# Patient Record
Sex: Female | Born: 1990 | Race: White | Hispanic: No | Marital: Married | Smoking: Never smoker
Health system: Southern US, Community
[De-identification: ages and names within clinical notes are randomized; demographics above are authoritative.]

## PROBLEM LIST (undated history)

## (undated) ENCOUNTER — Inpatient Hospital Stay: Payer: Self-pay

## (undated) DIAGNOSIS — F419 Anxiety disorder, unspecified: Secondary | ICD-10-CM

## (undated) HISTORY — PX: TONSILECTOMY, ADENOIDECTOMY, BILATERAL MYRINGOTOMY AND TUBES: SHX2538

---

## 2018-07-06 DIAGNOSIS — Z3480 Encounter for supervision of other normal pregnancy, unspecified trimester: Secondary | ICD-10-CM | POA: Insufficient documentation

## 2019-11-11 DIAGNOSIS — F411 Generalized anxiety disorder: Secondary | ICD-10-CM | POA: Insufficient documentation

## 2021-04-06 ENCOUNTER — Other Ambulatory Visit: Payer: Self-pay

## 2021-04-06 ENCOUNTER — Encounter: Payer: Self-pay | Admitting: Emergency Medicine

## 2021-04-06 ENCOUNTER — Emergency Department: Payer: BC Managed Care – PPO

## 2021-04-06 ENCOUNTER — Emergency Department
Admission: EM | Admit: 2021-04-06 | Discharge: 2021-04-06 | Disposition: A | Discharge status: Home / Self Care | Payer: BC Managed Care – PPO | Attending: Emergency Medicine | Admitting: Emergency Medicine

## 2021-04-06 DIAGNOSIS — R202 Paresthesia of skin: Secondary | ICD-10-CM

## 2021-04-06 DIAGNOSIS — G43809 Other migraine, not intractable, without status migrainosus: Secondary | ICD-10-CM | POA: Insufficient documentation

## 2021-04-06 DIAGNOSIS — F419 Anxiety disorder, unspecified: Secondary | ICD-10-CM | POA: Diagnosis not present

## 2021-04-06 DIAGNOSIS — G43109 Migraine with aura, not intractable, without status migrainosus: Secondary | ICD-10-CM

## 2021-04-06 HISTORY — DX: Anxiety disorder, unspecified: F41.9

## 2021-04-06 LAB — BASIC METABOLIC PANEL
Anion gap: 11 (ref 5–15)
BUN: 10 mg/dL (ref 6–20)
CO2: 23 mmol/L (ref 22–32)
Calcium: 9 mg/dL (ref 8.9–10.3)
Chloride: 103 mmol/L (ref 98–111)
Creatinine, Ser: 0.74 mg/dL (ref 0.44–1.00)
GFR, Estimated: >60 mL/min (ref >60)
Glucose, Bld: 97 mg/dL (ref 70–99)
Potassium: 3.8 mmol/L (ref 3.5–5.1)
Sodium: 137 mmol/L (ref 135–145)

## 2021-04-06 LAB — CBC
HCT: 37.6 % (ref 36.0–46.0)
Hemoglobin: 12.8 g/dL (ref 12.0–15.0)
MCH: 31.3 pg (ref 26.0–34.0)
MCHC: 34 g/dL (ref 30.0–36.0)
MCV: 91.9 fL (ref 80.0–100.0)
Platelets: 281 10*3/uL (ref 150–400)
RBC: 4.09 MIL/uL (ref 3.87–5.11)
RDW: 13.4 % (ref 11.5–15.5)
WBC: 6.9 10*3/uL (ref 4.0–10.5)
nRBC: 0 % (ref 0.0–0.2)

## 2021-04-06 LAB — PREGNANCY, URINE: Preg Test, Ur: NEGATIVE

## 2021-04-06 LAB — MAGNESIUM: Magnesium: 2 mg/dL (ref 1.7–2.4)

## 2021-04-06 IMAGING — CT CT CERVICAL SPINE W/O CM
3 of 4 series · 10 of 33 positions shown, 11 images · non-contrast
Comparison: None.

CLINICAL DATA: Focal neuro deficit or paresthesias; technologist
note states tingling on left side

EXAM:
CT CERVICAL SPINE WITHOUT CONTRAST
TECHNIQUE: Multidetector CT imaging of the cervical spine was performed without
intravenous contrast. Multiplanar CT image reconstructions were also
generated.

[Series 6: sagittal bone · sagittal · 0.20mm/px · 5 of 37 slices shown]
[im 13/37  bone]
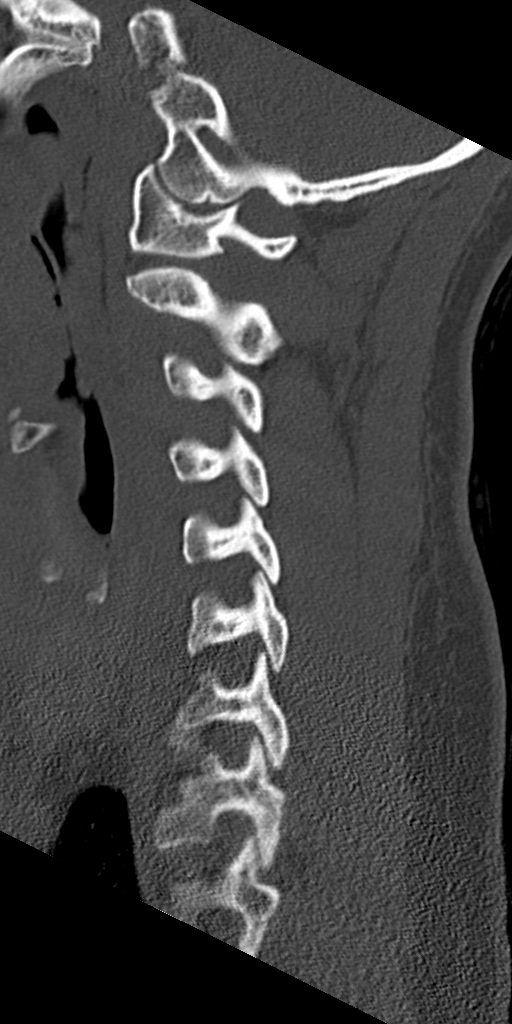
[im 16/37  bone]
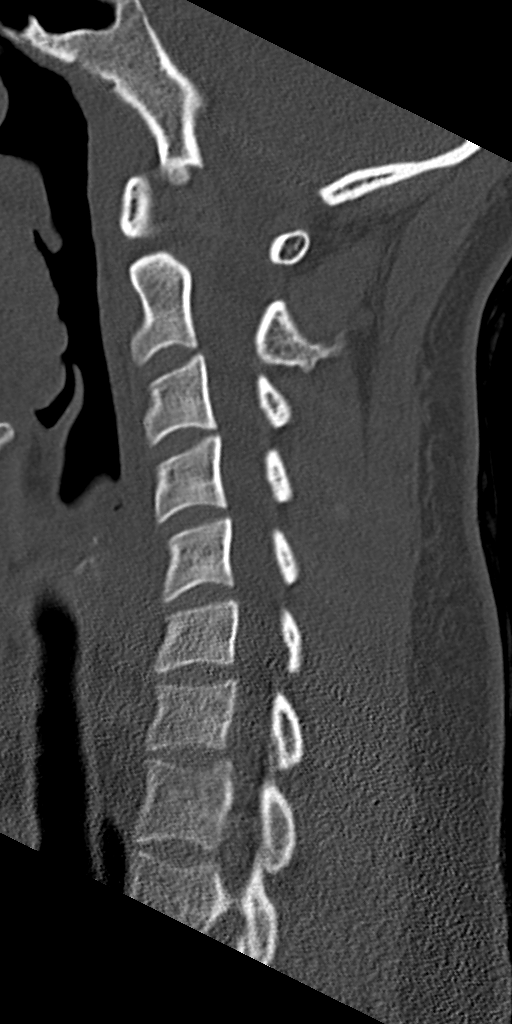
[im 19/37  bone]
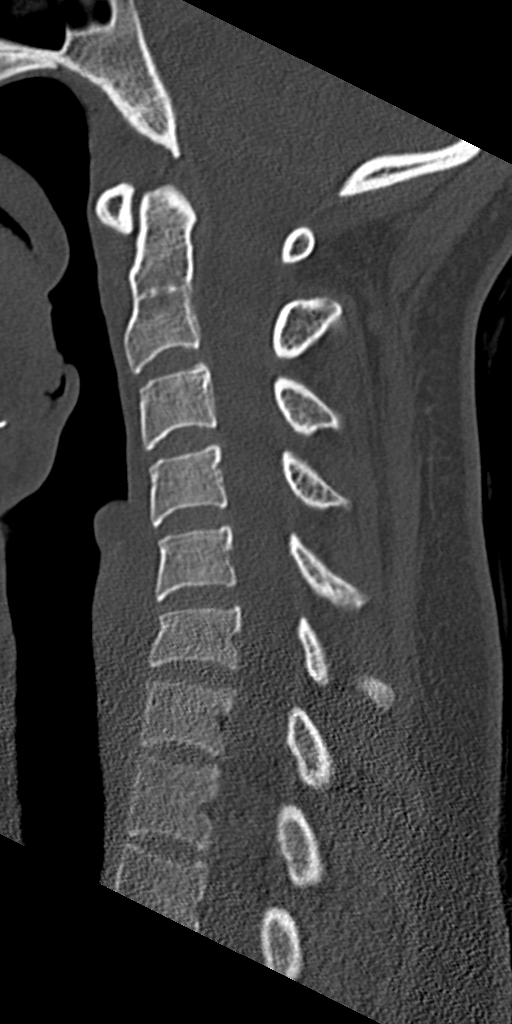
[im 22/37  bone]
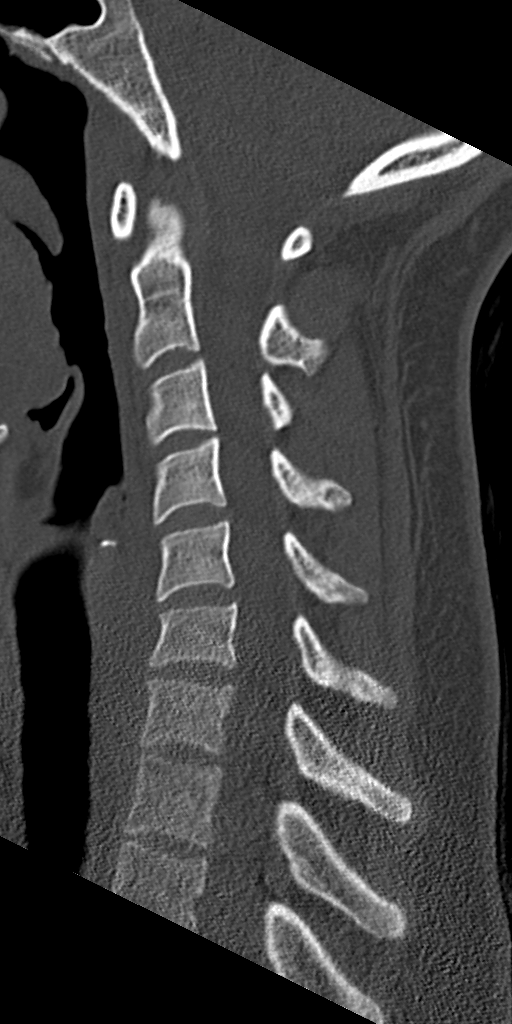
[im 25/37  bone]
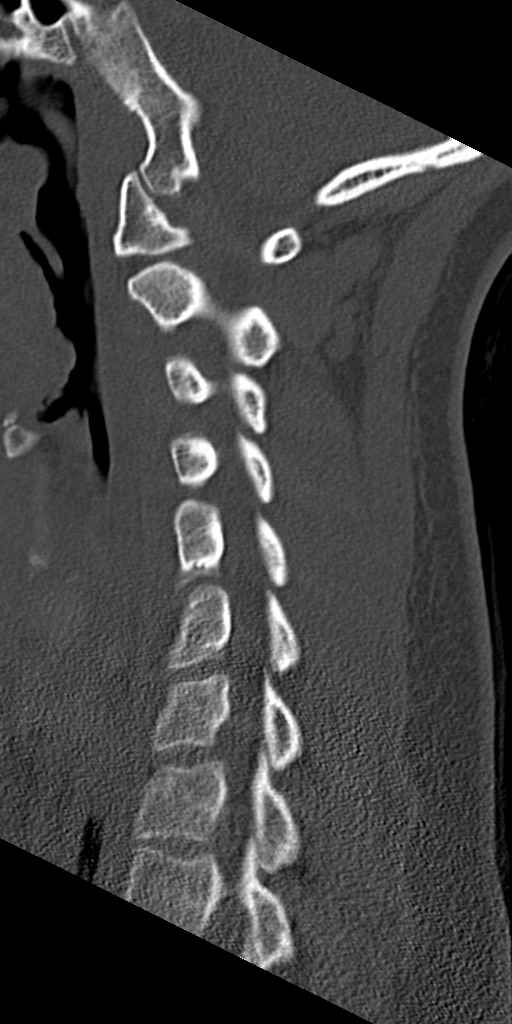

[Series 7: coronal bone · coronal · 0.17mm/px · 3 of 32 slices shown]
[im 7/32  bone]
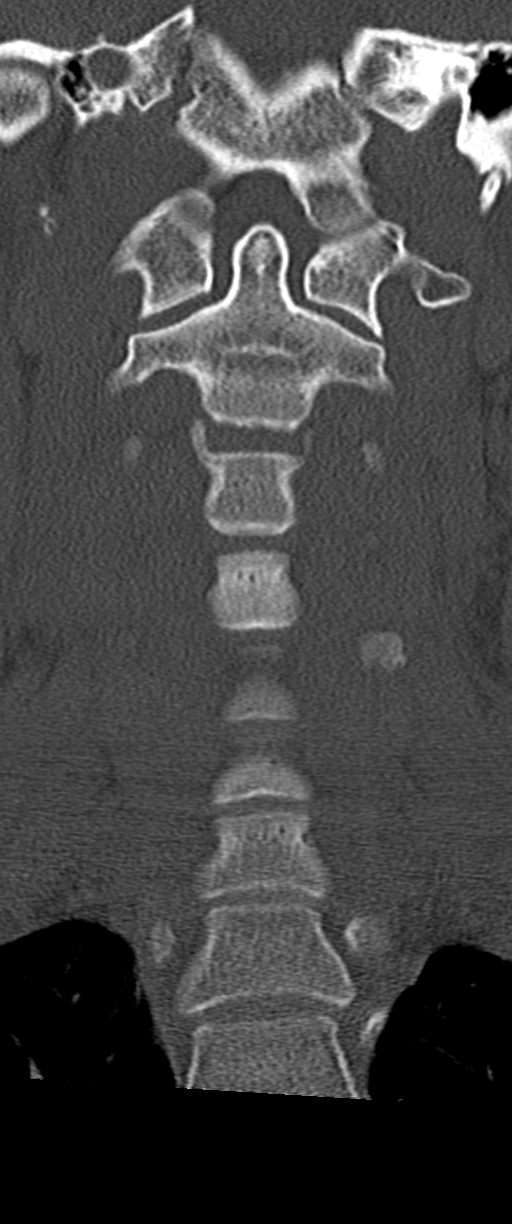
[im 13/32  bone]
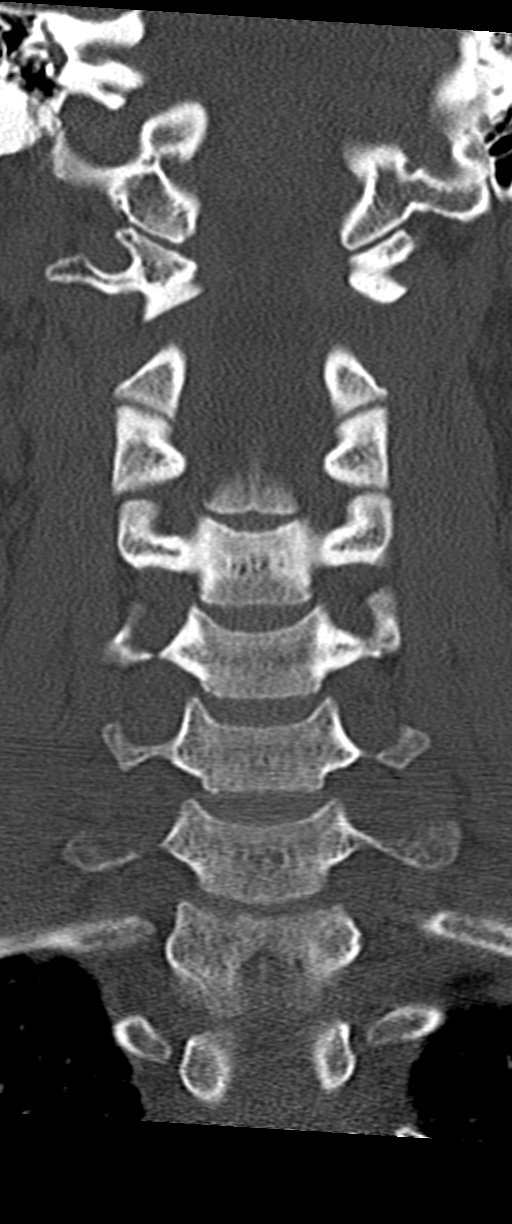
[im 19/32  bone]
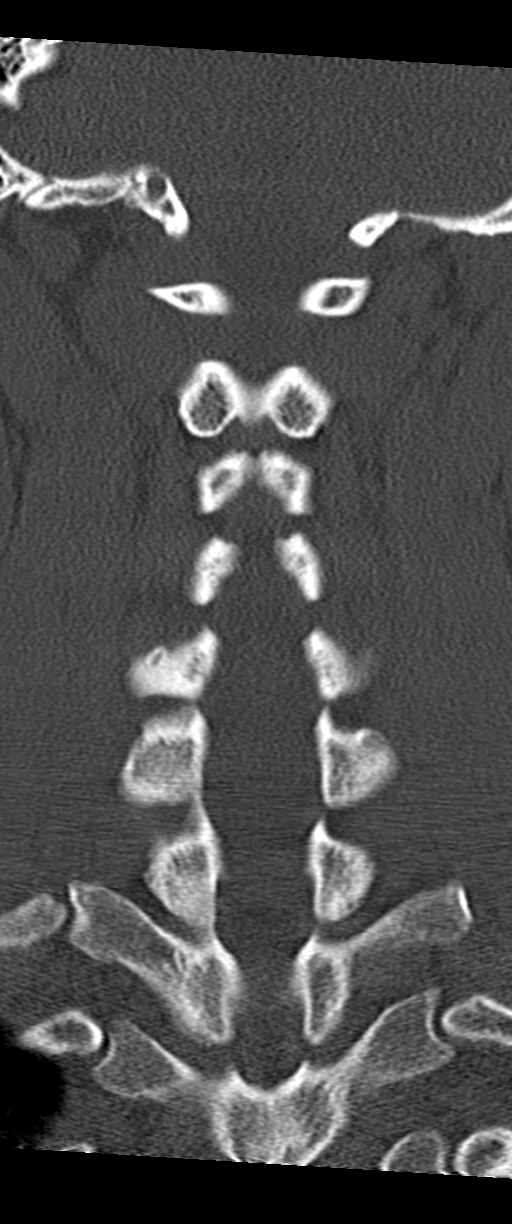

[Series 8: orthogonal bone · axial · 0.19mm/px · z∈[-312,-264]mm · 2 of 82 slices shown, 3 images]
[im 28/82  soft-tissue]
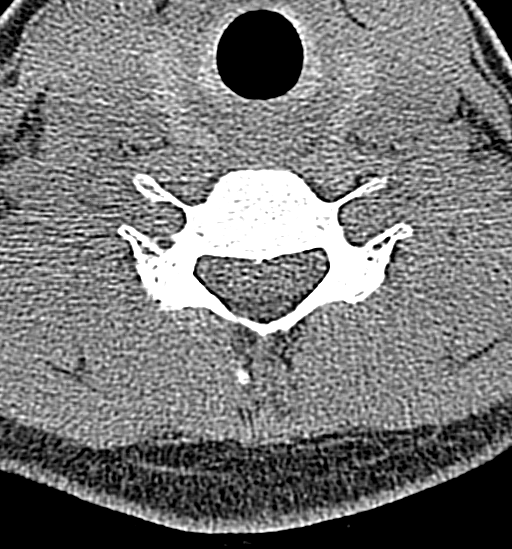
[im 28/82  bone]
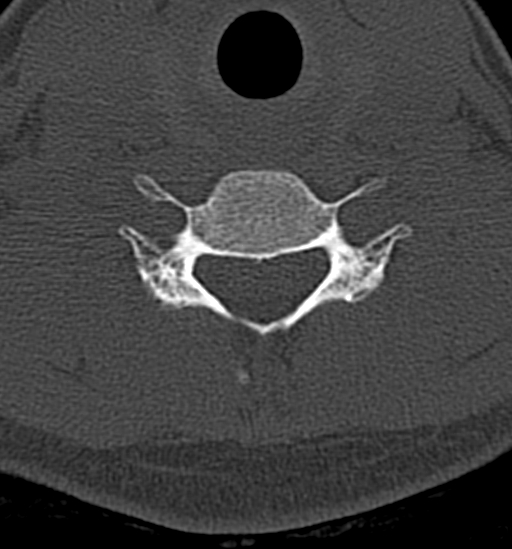
[im 55/82  bone]
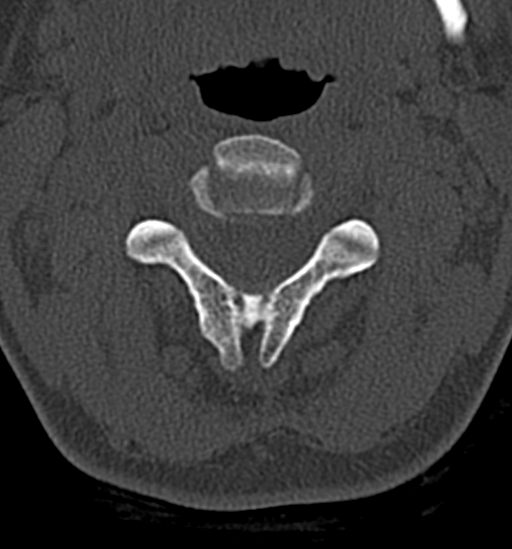

[10 of 33 positions shown; findings below may reference images not displayed]

FINDINGS: Alignment: Anteroposterior alignment is maintained.

Skull base and vertebrae: Vertebral body heights are preserved. No
acute fracture.

Soft tissues and spinal canal: No prevertebral fluid or swelling. No
visible canal hematoma.

Disc levels: Intervertebral disc heights are maintained. No
degenerative stenosis.

Upper chest: Included lung apices are clear.

Other: None.
IMPRESSION: No acute or significant abnormality.

## 2021-04-06 IMAGING — CT CT HEAD W/O CM
4 series · 16 of 47 positions shown, 18 images · non-contrast
Comparison: None.

CLINICAL DATA: Neuro deficit, acute, stroke suspected; technologist
note states numbness and tingling on left side

EXAM:
CT HEAD WITHOUT CONTRAST
TECHNIQUE: Contiguous axial images were obtained from the base of the skull
through the vertex without intravenous contrast.

[Series 2: head bone · axial · 0.43mm/px · z∈[-179,-151]mm · 3 of 70 slices shown]
[im 7/70  bone]
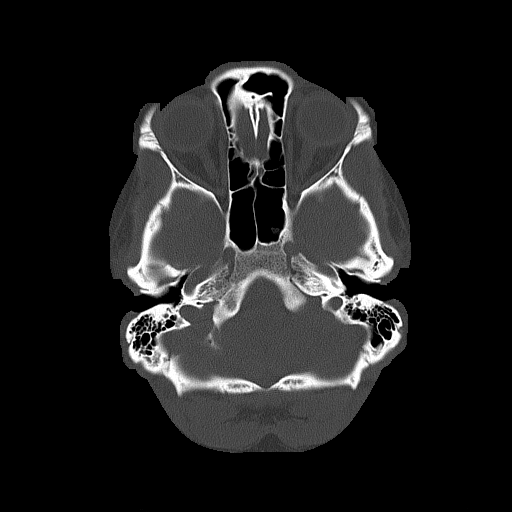
[im 14/70  bone]
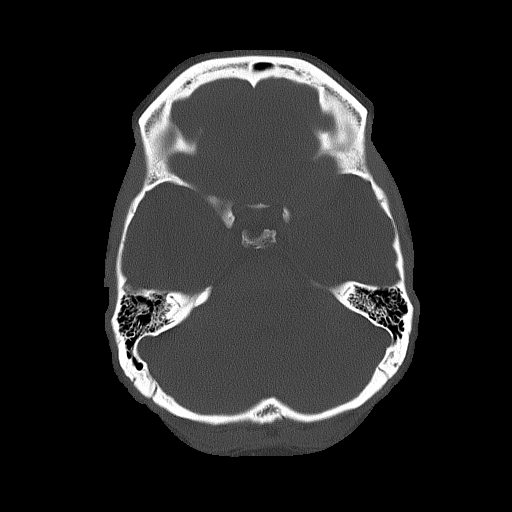
[im 21/70  bone]
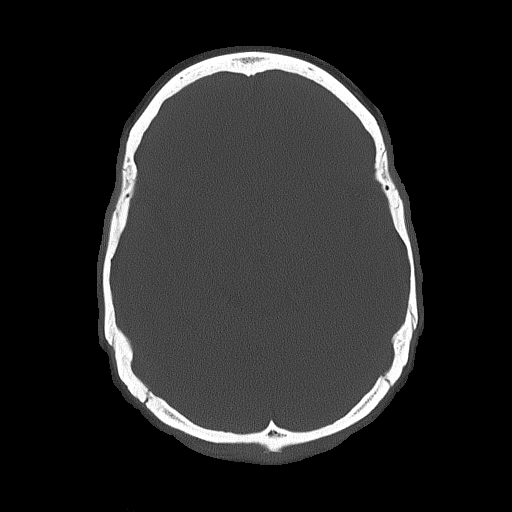

[Series 3: head wo · axial · 0.43mm/px · z∈[-176,-76]mm · 7 of 28 slices shown, 9 images]
[im 4/28  brain]
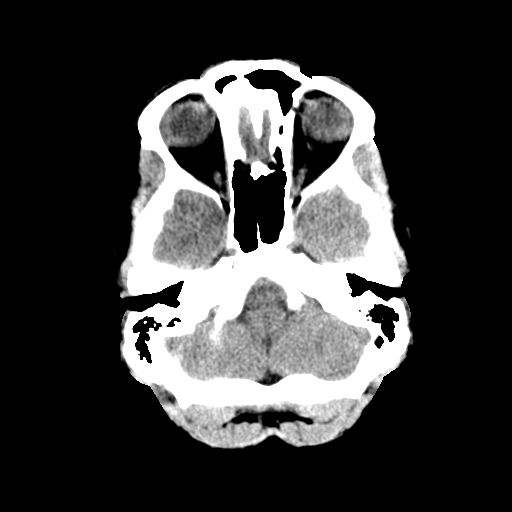
[im 4/28  bone]
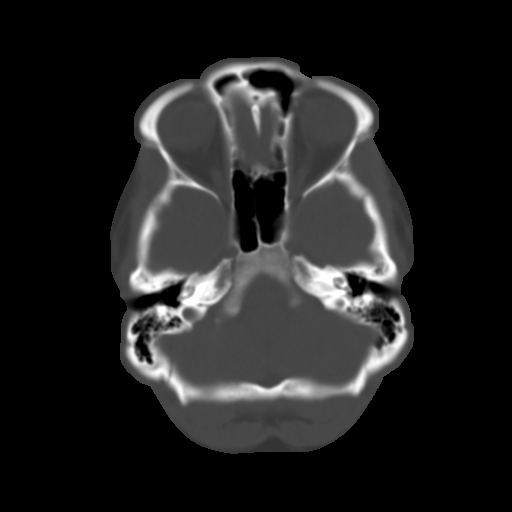
[im 7/28  brain]
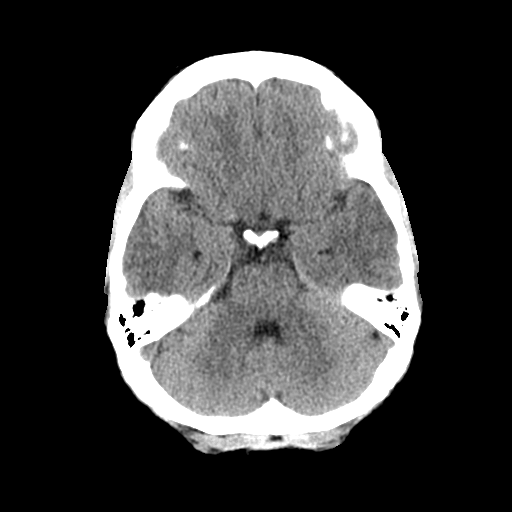
[im 11/28  brain]
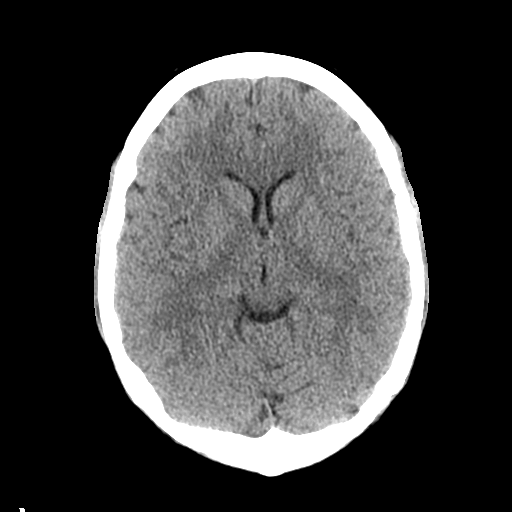
[im 14/28  brain]
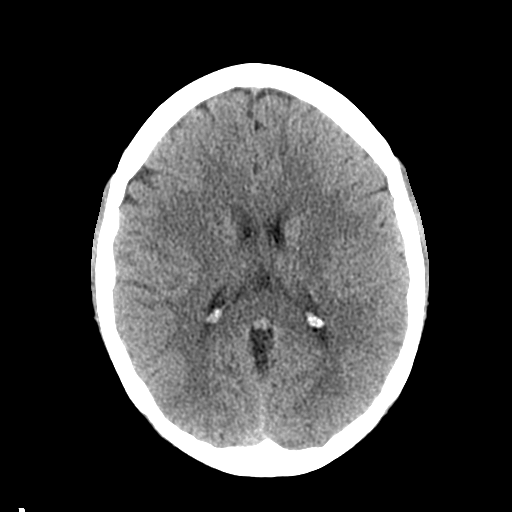
[im 17/28  brain]
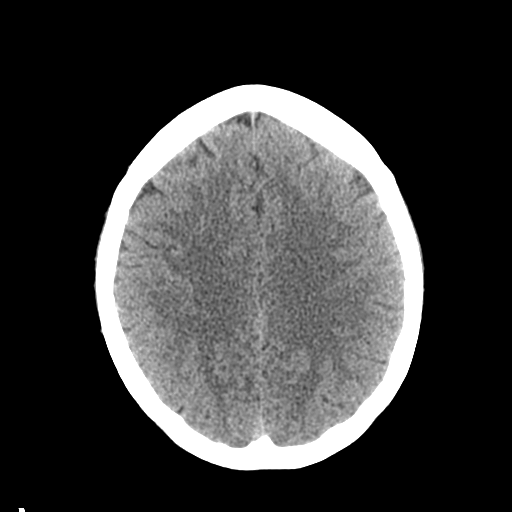
[im 17/28  bone]
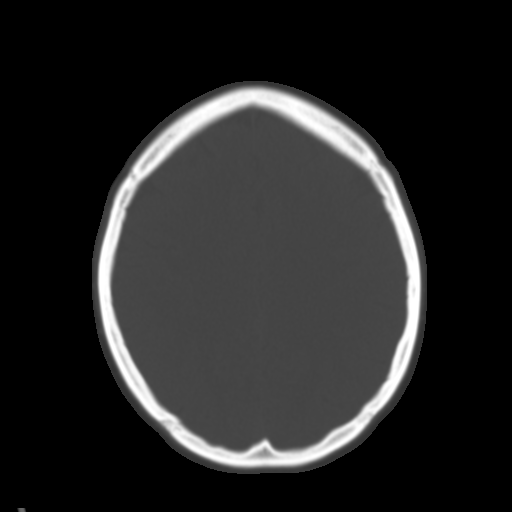
[im 21/28  brain]
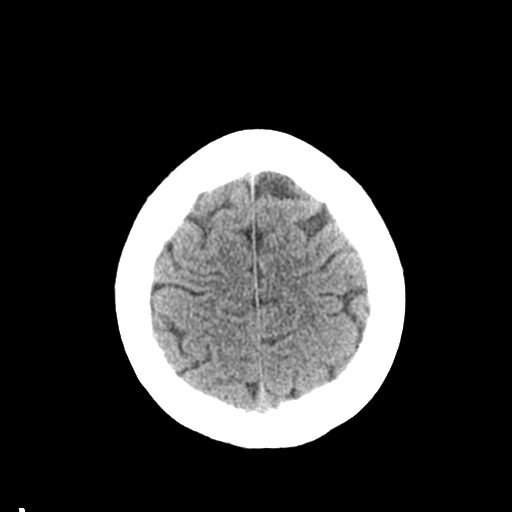
[im 24/28  brain]
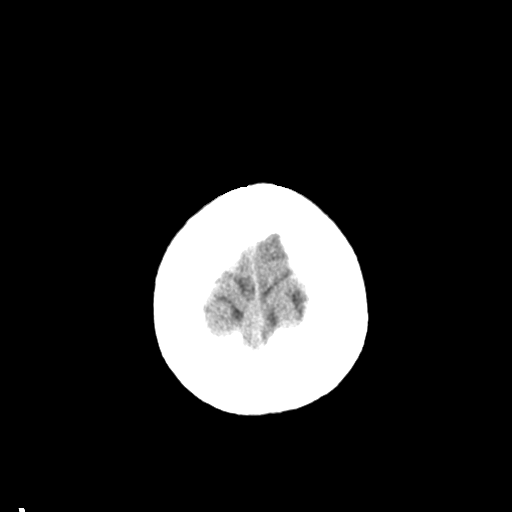

[Series 4: coronal soft tissue · coronal · 0.29mm/px · 3 of 61 slices shown]
[im 21/61  brain]
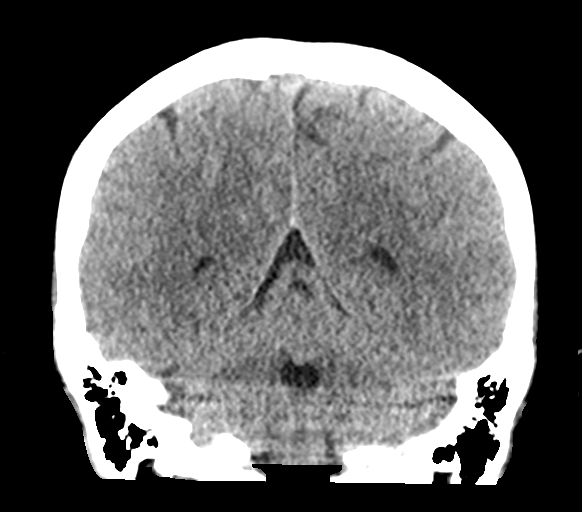
[im 27/61  brain]
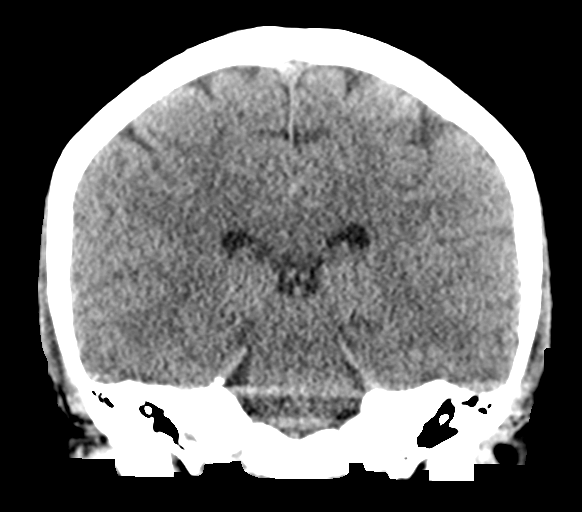
[im 34/61  brain]
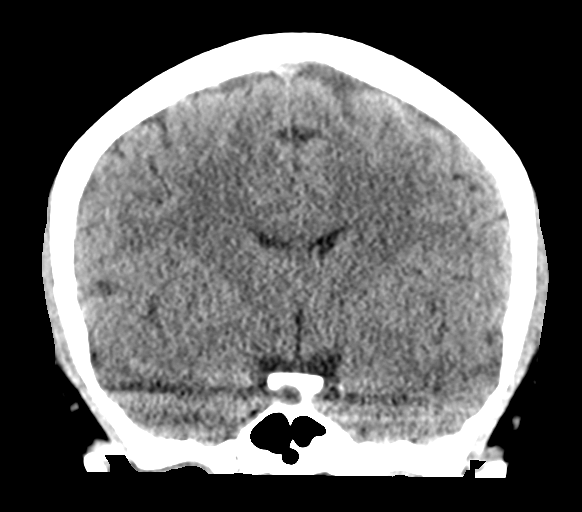

[Series 5: sagittal soft tissue · sagittal · 0.29mm/px · 3 of 52 slices shown]
[im 18/52  brain]
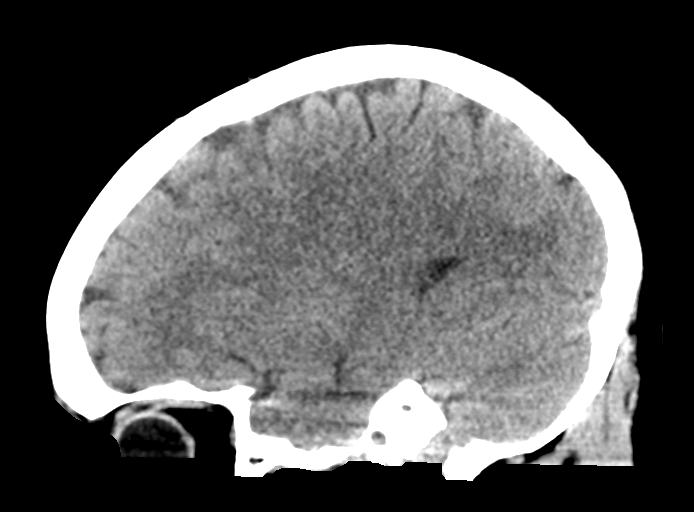
[im 26/52  brain]
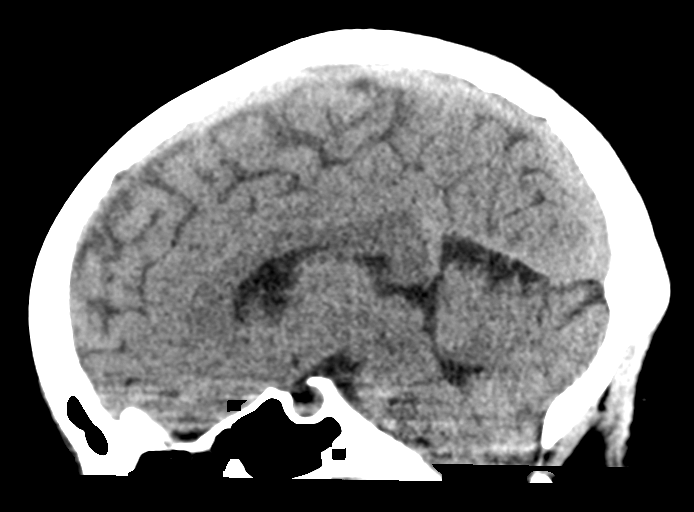
[im 35/52  brain]
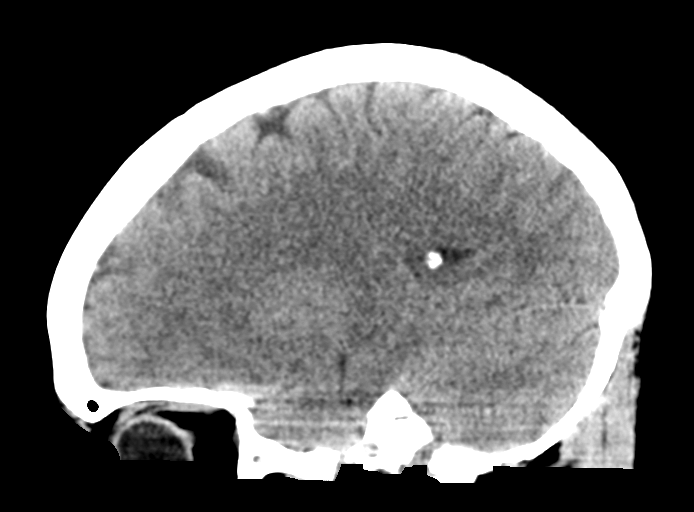

[16 of 47 positions shown; findings below may reference images not displayed]

FINDINGS: Brain: There is no acute intracranial hemorrhage, mass effect, or
edema. Gray-white differentiation is preserved. There is no
extra-axial fluid collection. Ventricles and sulci are within normal
limits in size and configuration.

Vascular: No hyperdense vessel or unexpected calcification.

Skull: Calvarium is unremarkable.

Sinuses/Orbits: Mild mucosal thickening.  Orbits are unremarkable.

Other: None.
IMPRESSION: No acute intracranial abnormality.

## 2021-04-06 MED ORDER — DIPHENHYDRAMINE HCL 50 MG/ML IJ SOLN
25.0000 mg | Freq: Once | INTRAMUSCULAR | Status: AC
  Administered 2021-04-06: 25 mg via INTRAVENOUS
  Filled 2021-04-06: qty 1

## 2021-04-06 MED ORDER — PROCHLORPERAZINE EDISYLATE 10 MG/2ML IJ SOLN
10.0000 mg | Freq: Once | INTRAMUSCULAR | Status: AC
  Administered 2021-04-06: 10 mg via INTRAVENOUS
  Filled 2021-04-06: qty 2

## 2021-04-06 MED ORDER — SODIUM CHLORIDE 0.9 % IV BOLUS
1000.0000 mL | Freq: Once | INTRAVENOUS | Status: AC
  Administered 2021-04-06: 1000 mL via INTRAVENOUS

## 2021-04-06 NOTE — ED Provider Notes (Signed)
[facility] Emergency Department Provider Note   ____________________________________________   Event Date/Time   First MD Initiated Contact with Patient 04/06/21 1139     (approximate)  I have reviewed the triage vital signs and the nursing notes.   HISTORY  Chief Complaint Numbness    HPI Aimee Jackson is a 30 y.o. female with past medical history of anxiety presents to the ED complaining of numbness.  Patient reports that she has been dealing with numbness and tingling in both sides of her neck over the past couple of days.  She went to see her chiropractor for this, but since yesterday has been dealing with waxing and waning numbness and tingling affecting her left arm and left leg.  She denies having any significant adjustment performed at her chiropractor.  She denies any numbness or weakness in her arm or leg and has not had any recent trauma to her head or neck.  She does state that she has had a mild headache with light sensitivity.  She denies any fevers, neck stiffness, cough, chest pain, or shortness of breath.  Her LMP was approximately 2 weeks ago.  She denies any history of migraines and has never had similar symptoms in the past.  She does state that she has been under a lot of stress lately with increased anxiety.        Past Medical History:  Diagnosis Date   Anxiety     There are no problems to display for this patient.   History reviewed. No pertinent surgical history.  Prior to Admission medications   Not on File    Allergies Aleve [naproxen] and Codeine  No family history on file.  Social History    Review of Systems  Constitutional: No fever/chills Eyes: No visual changes. ENT: No sore throat. Cardiovascular: Denies chest pain. Respiratory: Denies shortness of breath. Gastrointestinal: No abdominal pain.  No nausea, no vomiting.  No diarrhea.  No constipation. Genitourinary: Negative for  dysuria. Musculoskeletal: Negative for back pain. Skin: Negative for rash. Neurological: Positive for headache, negative for focal weakness, positive for numbness.  ____________________________________________   PHYSICAL EXAM:  VITAL SIGNS: ED Triage Vitals  Enc Vitals Group     BP 04/06/21 0908 133/86     Pulse Rate 04/06/21 0908 96     Resp 04/06/21 0908 20     Temp 04/06/21 0908 98.6 F (37 C)     Temp Source 04/06/21 0908 Oral     SpO2 04/06/21 0908 100 %     Weight 04/06/21 0909 155 lb (70.3 kg)     Height 04/06/21 0909 5\' 5"  (1.651 m)     Head Circumference --      Peak Flow --      Pain Score 04/06/21 0908 0     Pain Loc --      Pain Edu? --      Excl. in GC? --     Constitutional: Alert and oriented. Eyes: Conjunctivae are normal.  Pupils equal, round, and reactive to light bilaterally. Head: Atraumatic. Nose: No congestion/rhinnorhea. Mouth/Throat: Mucous membranes are moist. Neck: Normal ROM, no midline cervical spine tenderness to palpation. Cardiovascular: Normal rate, regular rhythm. Grossly normal heart sounds.  2+ radial pulses bilaterally. Respiratory: Normal respiratory effort.  No retractions. Lungs CTAB. Gastrointestinal: Soft and nontender. No distention. Genitourinary: deferred Musculoskeletal: No lower extremity tenderness nor edema. Neurologic:  Normal speech and language. No gross focal neurologic deficits are appreciated. Skin:  Skin is warm,  dry and intact. No rash noted. Psychiatric: Mood and affect are normal. Speech and behavior are normal.  ____________________________________________   LABS (all labs ordered are listed, but only abnormal results are displayed)  Labs Reviewed  CBC  BASIC METABOLIC PANEL  MAGNESIUM  PREGNANCY, URINE   ____________________________________________  EKG  ED ECG REPORT I, Chesley Noon, the attending physician, personally viewed and interpreted this ECG.   Date: 04/06/2021  EKG Time: 12:59   Rate: 66  Rhythm: normal sinus rhythm  Axis: Normal  Intervals:none  ST&T Change: None   PROCEDURES  Procedure(s) performed (including Critical Care):  Procedures   ____________________________________________   INITIAL IMPRESSION / ASSESSMENT AND PLAN / ED COURSE      30 year old female with past medical history of anxiety presents to the ED with numbness and tingling to her neck along with numbness and tingling extending down her left arm and left leg is been associated with headache and some light sensitivity, although she denies any history of migraines.  She does not appear to have any focal neurologic deficits on exam, strength is intact in all 4 extremities.  CT head and cervical spine were performed and negative for acute process.  Labs are unremarkable with no electrolyte abnormality.  She was given a migraine cocktail for possible complicated migraine, now states that her symptoms are improved.  There is no history of significant neck pain or trauma to suggest cervical cord compression and no pain or neurologic deficits to suggest carotid artery dissection.  She is appropriate for discharge home with neurology follow-up for paresthesias, was counseled to return to the ED for new worsening symptoms, patient agrees with plan.      ____________________________________________   FINAL CLINICAL IMPRESSION(S) / ED DIAGNOSES  Final diagnoses:  Paresthesia  Complicated migraine  Anxiety     ED Discharge Orders     None        Note:  This document was prepared using Dragon voice recognition software and may include unintentional dictation errors.    Chesley Noon, MD 04/06/21 1538

## 2021-04-06 NOTE — ED Triage Notes (Addendum)
Pt comes into the ED via POV c/o numbness and tingling on the left side of her body that started yesterday.  Pt states initially she had numbness at the base of her neck and neck pain, she then went to the chiropractor to see if it would help, and then the numbness progressed to the left side of face, leg, and arm.  Pt ambulatory to triage at this time with no drift noted.  Pt tearful in triage currently but has even and unlabored respirations. Pt does admit to h/o anxiety and feels like it may be playing a large role in the symptoms today.

## 2021-04-10 ENCOUNTER — Emergency Department: Payer: BC Managed Care – PPO

## 2021-04-10 ENCOUNTER — Other Ambulatory Visit: Payer: Self-pay

## 2021-04-10 ENCOUNTER — Emergency Department
Admission: EM | Admit: 2021-04-10 | Discharge: 2021-04-10 | Disposition: A | Discharge status: Home / Self Care | Payer: BC Managed Care – PPO | Attending: Emergency Medicine | Admitting: Emergency Medicine

## 2021-04-10 DIAGNOSIS — R202 Paresthesia of skin: Secondary | ICD-10-CM | POA: Diagnosis not present

## 2021-04-10 DIAGNOSIS — R0789 Other chest pain: Secondary | ICD-10-CM | POA: Diagnosis not present

## 2021-04-10 DIAGNOSIS — R0602 Shortness of breath: Secondary | ICD-10-CM | POA: Insufficient documentation

## 2021-04-10 DIAGNOSIS — Z79899 Other long term (current) drug therapy: Secondary | ICD-10-CM | POA: Insufficient documentation

## 2021-04-10 DIAGNOSIS — R079 Chest pain, unspecified: Secondary | ICD-10-CM | POA: Diagnosis present

## 2021-04-10 LAB — CBC
HCT: 38.5 % (ref 36.0–46.0)
Hemoglobin: 13.4 g/dL (ref 12.0–15.0)
MCH: 31.6 pg (ref 26.0–34.0)
MCHC: 34.8 g/dL (ref 30.0–36.0)
MCV: 90.8 fL (ref 80.0–100.0)
Platelets: 278 10*3/uL (ref 150–400)
RBC: 4.24 MIL/uL (ref 3.87–5.11)
RDW: 13.2 % (ref 11.5–15.5)
WBC: 8.5 10*3/uL (ref 4.0–10.5)
nRBC: 0 % (ref 0.0–0.2)

## 2021-04-10 LAB — COMPREHENSIVE METABOLIC PANEL
ALT: 13 U/L (ref 0–44)
AST: 20 U/L (ref 15–41)
Albumin: 4.6 g/dL (ref 3.5–5.0)
Alkaline Phosphatase: 52 U/L (ref 38–126)
Anion gap: 9 (ref 5–15)
BUN: 9 mg/dL (ref 6–20)
CO2: 28 mmol/L (ref 22–32)
Calcium: 10.6 mg/dL — ABNORMAL HIGH (ref 8.9–10.3)
Chloride: 101 mmol/L (ref 98–111)
Creatinine, Ser: 0.7 mg/dL (ref 0.44–1.00)
GFR, Estimated: >60 mL/min (ref >60)
Glucose, Bld: 105 mg/dL — ABNORMAL HIGH (ref 70–99)
Potassium: 3.8 mmol/L (ref 3.5–5.1)
Sodium: 138 mmol/L (ref 135–145)
Total Bilirubin: 0.9 mg/dL (ref 0.3–1.2)
Total Protein: 7.6 g/dL (ref 6.5–8.1)

## 2021-04-10 LAB — HCG, QUANTITATIVE, PREGNANCY: hCG, Beta Chain, Quant, S: 1 m[IU]/mL (ref <5)

## 2021-04-10 LAB — D-DIMER, QUANTITATIVE: D-Dimer, Quant: 0.46 ug/mL-FEU (ref 0.00–0.50)

## 2021-04-10 LAB — TROPONIN I (HIGH SENSITIVITY)
Troponin I (High Sensitivity): 3 ng/L (ref <18)
Troponin I (High Sensitivity): <2 ng/L (ref <18)

## 2021-04-10 IMAGING — CR DG CHEST 2V
2 series · 2 of 2 positions shown · non-contrast
Comparison: None.

CLINICAL DATA: This study identified as missing a report at [DATE] on [DATE].

30-year-old female with chest pain. Left arm tightness. Intermittent
symptoms since last week.
EXAM:
CHEST - 2 VIEW

[chest pa]
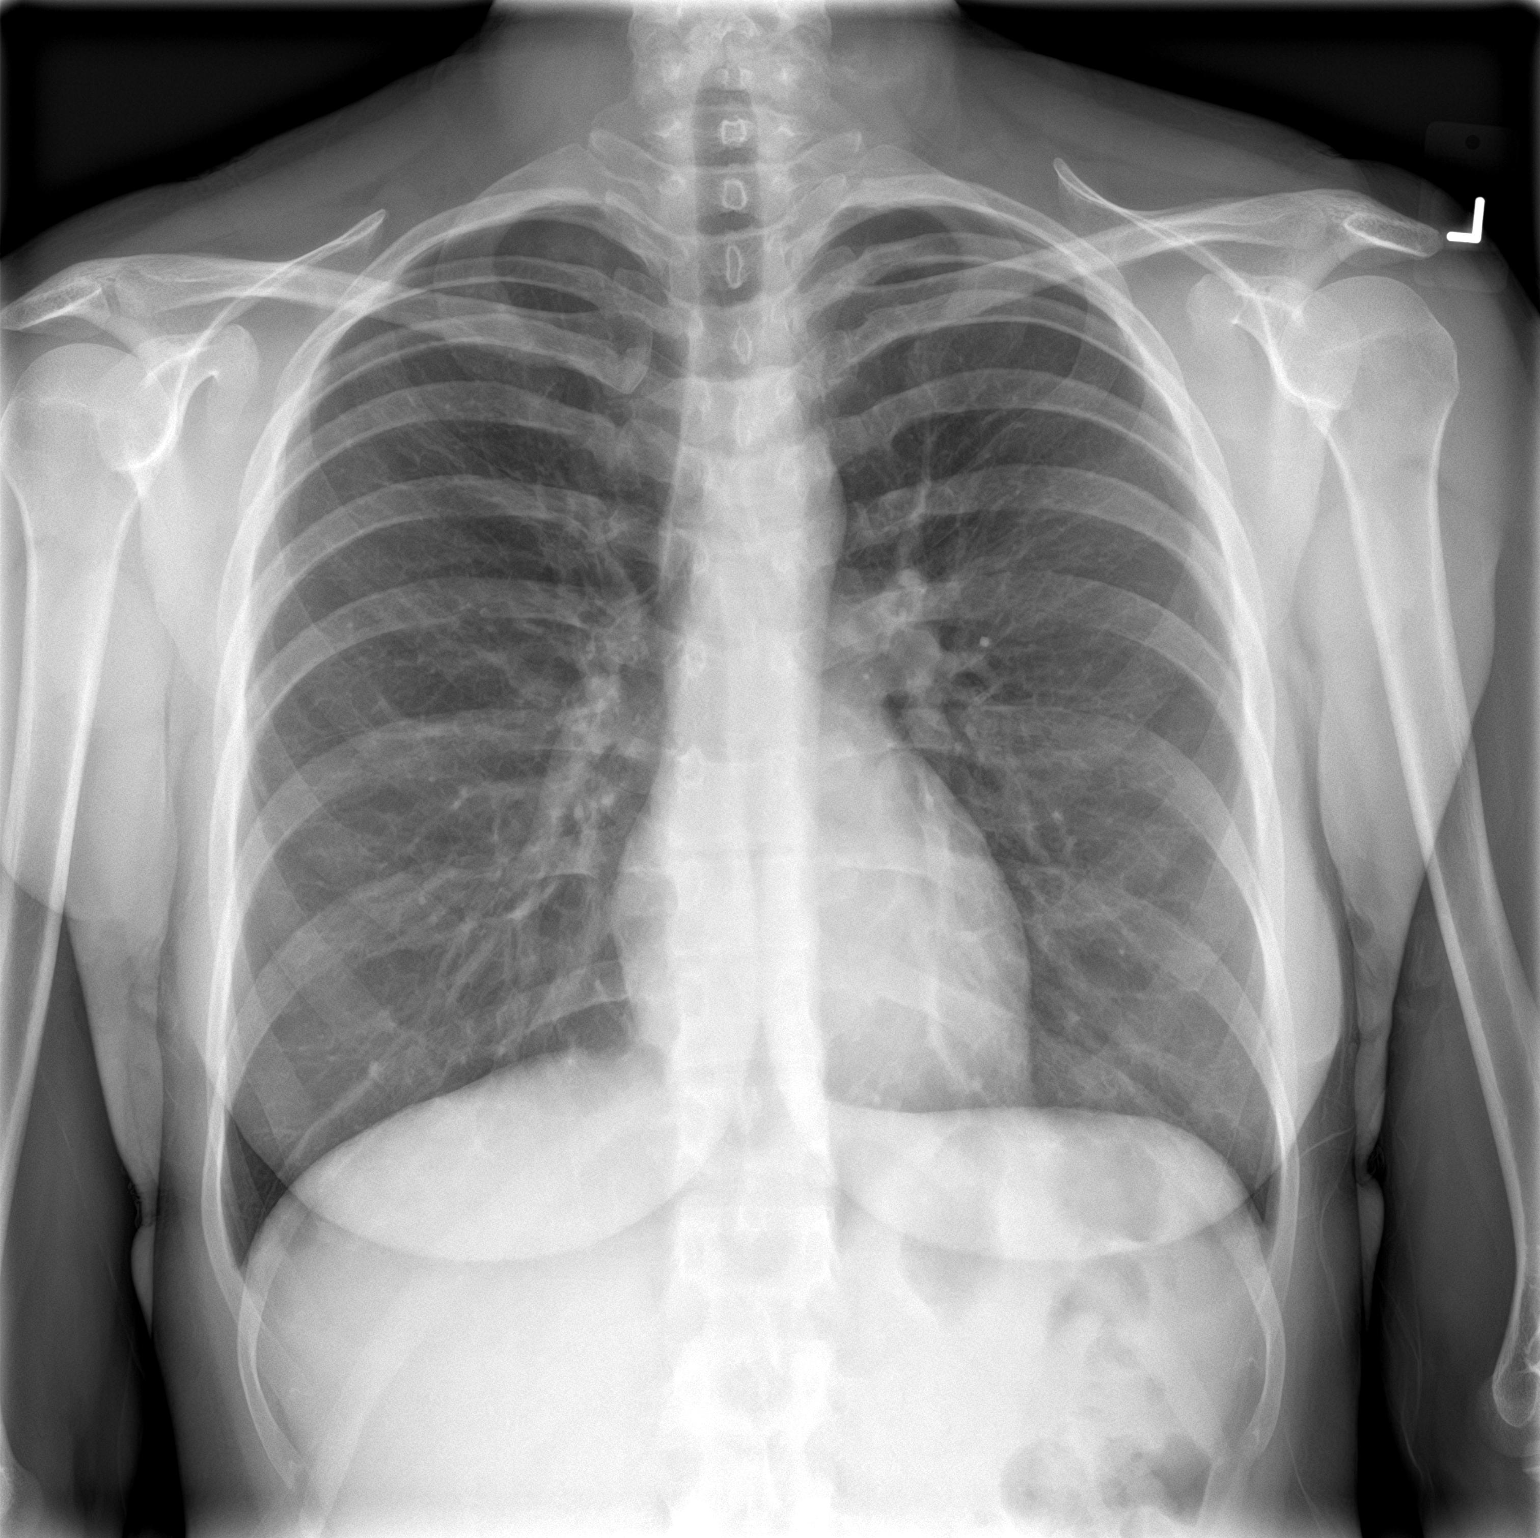

[chest lat]
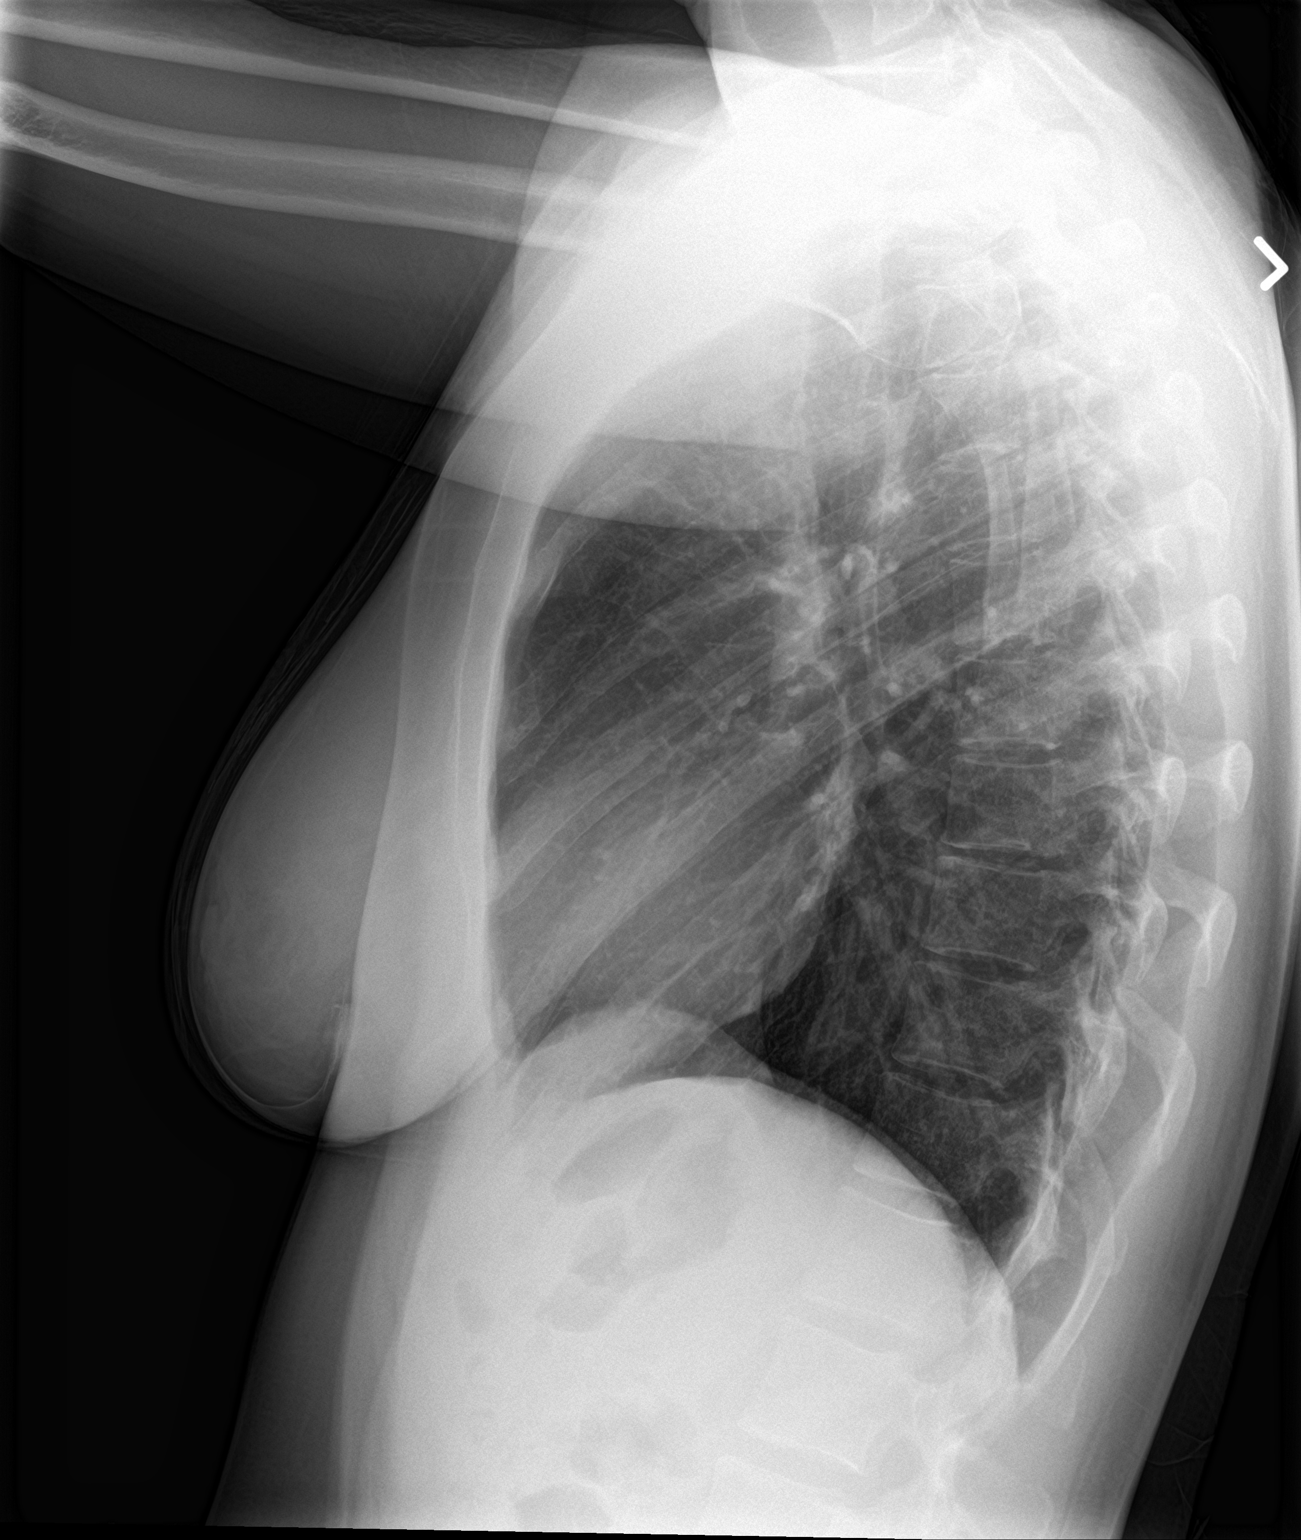

[2 of 2 positions shown; findings below may reference images not displayed]

FINDINGS: Normal lung volumes and mediastinal contours. Visualized tracheal
air column is within normal limits. Both lungs appear clear. No
pneumothorax or pleural effusion.

No osseous abnormality identified. Negative visible bowel gas
pattern.
IMPRESSION: Negative.  No cardiopulmonary abnormality.

## 2021-04-10 MED ORDER — LORAZEPAM 2 MG/ML IJ SOLN
1.0000 mg | Freq: Once | INTRAMUSCULAR | Status: AC
  Administered 2021-04-10: 1 mg via INTRAVENOUS
  Filled 2021-04-10: qty 1

## 2021-04-10 MED ORDER — LORAZEPAM 1 MG PO TABS: 1.0000 mg | ORAL_TABLET | Freq: Three times a day (TID) | ORAL | 0 refills | Status: AC | PRN

## 2021-04-10 NOTE — ED Provider Notes (Signed)
[facility] Emergency Department Provider Note  ____________________________________________   Event Date/Time   First MD Initiated Contact with Patient 04/10/21 [phone]     (approximate)  I have reviewed the triage vital signs and the nursing notes.   HISTORY  Chief Complaint No chief complaint on file.    HPI Shataria Crist is a 30 y.o. female with history of postpartum anxiety who presents to the emergency department with complaints of intermittent chest tightness that goes into her shoulders, shortness of breath, tingling in the left face and arm with clammy hands and feet that started Thursday, August 18.  States she is also feeling "foggy minded".  Denies fevers, cough, nausea, vomiting, diaphoresis, dizziness.  Was seen here in the emergency department on August 19 and had normal labs, CT head and cervical spine.  She states she does vape.  Has a copper IUD.  No history of PE, DVT, exogenous estrogen use, recent fractures, surgery, trauma, hospitalization, prolonged travel or other immobilization. No lower extremity swelling or pain. No calf tenderness.  No aggravating or alleviating factors.  No family history of coronary artery disease but states mother has history of Wolff-Parkinson-White syndrome.  She does not have a local primary care physician as she states they just moved to this area.  She states she was concerned she could have a vitamin deficiency.  States she was seen in urgent care and had blood work done and is waiting for these test results.   Past Medical History:  Diagnosis Date   Anxiety     There are no problems to display for this patient.   No past surgical history on file.  Prior to Admission medications   Medication Sig Start Date End Date Taking? Authorizing Provider  LORazepam (ATIVAN) 1 MG tablet Take 1 tablet (1 mg total) by mouth every 8 (eight) hours as needed for anxiety. 04/10/21 04/10/22 Yes Haneef Hallquist, Layla Maw, DO     Allergies Aleve [naproxen] and Codeine  No family history on file.  Social History    Review of Systems Constitutional: No fever. Eyes: No visual changes. ENT: No sore throat. Cardiovascular: +chest pain. Respiratory: +shortness of breath. Gastrointestinal: No nausea, vomiting, diarrhea. Genitourinary: Negative for dysuria. Musculoskeletal: Negative for back pain. Skin: Negative for rash. Neurological: Negative for focal weakness.  + numbness.  ____________________________________________   PHYSICAL EXAM:  VITAL SIGNS: ED Triage Vitals [04/10/21 0207]  Enc Vitals Group     BP (!) 143/107     Pulse Rate 80     Resp 20     Temp 98.5 F (36.9 C)     Temp Source Oral     SpO2 100 %     Weight 155 lb (70.3 kg)     Height 5\' 5"  (1.651 m)     Head Circumference      Peak Flow      Pain Score 3     Pain Loc      Pain Edu?      Excl. in GC?    CONSTITUTIONAL: Alert and oriented and responds appropriately to questions.  Appears anxious, tearful HEAD: Normocephalic EYES: Conjunctivae clear, pupils appear equal, EOM appear intact ENT: normal nose; moist mucous membranes NECK: Supple, normal ROM CARD: RRR; S1 and S2 appreciated; no murmurs, no clicks, no rubs, no gallops CHEST:  Chest wall is nontender to palpation.  No crepitus, ecchymosis, erythema, warmth, rash or other lesions present.   RESP: Normal chest excursion without splinting or tachypnea;  breath sounds clear and equal bilaterally; no wheezes, no rhonchi, no rales, no hypoxia or respiratory distress, speaking full sentences ABD/GI: Normal bowel sounds; non-distended; soft, non-tender, no rebound, no guarding, no peritoneal signs, no hepatosplenomegaly BACK: The back appears normal EXT: Normal ROM in all joints; no deformity noted, no edema; no cyanosis, no calf tenderness or calf swelling SKIN: Normal color for age and race; warm; no rash on exposed skin NEURO: Moves all extremities equally, reports  diminished sensation in part of her left forehead and left upper cheek as well as her left forearm but otherwise sensation to light touch intact diffusely.  Normal gait.  Cranial nerves II through XII intact.  Normal speech. PSYCH: Anxious, tearful  ____________________________________________   LABS (all labs ordered are listed, but only abnormal results are displayed)  Labs Reviewed  COMPREHENSIVE METABOLIC PANEL - Abnormal; Notable for the following components:      Result Value   Glucose, Bld 105 (*)    Calcium 10.6 (*)    All other components within normal limits  CBC  D-DIMER, QUANTITATIVE  HCG, QUANTITATIVE, PREGNANCY  TROPONIN I (HIGH SENSITIVITY)  TROPONIN I (HIGH SENSITIVITY)   ____________________________________________  EKG   EKG Interpretation  Date/Time:  Tuesday April 10 2021 02:08:57 EDT Ventricular Rate:  77 PR Interval:  116 QRS Duration: 86 QT Interval:  374 QTC Calculation: 423 R Axis:   72 Text Interpretation: Normal sinus rhythm Normal ECG Confirmed by Rochele Raring [phone]) on 04/10/2021 6:03:35 AM        ____________________________________________  RADIOLOGY Normajean Baxter Dejia Ebron, personally viewed and evaluated these images (plain radiographs) as part of my medical decision making, as well as reviewing the written report by the radiologist.  ED MD interpretation: Chest x-ray clear.  Official radiology report(s): No results found.  ____________________________________________   PROCEDURES  Procedure(s) performed (including Critical Care):  Procedures   ____________________________________________   INITIAL IMPRESSION / ASSESSMENT AND PLAN / ED COURSE  As part of my medical decision making, I reviewed the following data within the electronic MEDICAL RECORD NUMBER Nursing notes reviewed and incorporated, Labs reviewed , EKG interpreted , Old EKG reviewed, Old chart reviewed, Radiograph reviewed , Notes from prior ED visits, and [place] Controlled  Substance Database         Patient here with complaints of atypical chest pain and left-sided numbness.  EKG is nonischemic.  No arrhythmia.  No interval abnormalities.  She has no risk factors for PE.  Doubt dissection.  No risk factors for ACS.  Troponin x1 negative.  Repeat already pending.  Chest x-ray clear without edema, infiltrate or pneumothorax.  Low suspicion that she is having a stroke.  Her neurologic symptoms are nonspecific and she is only having tingling and numbness in parts of her face and arm.  She had a negative CT of her head on the 19th.  I do not feel she needs an MRI of her brain.  I suspect that anxiety is playing a role and she questions if this could be playing a role as well.  She states that she does not have anything to be anxious about however.  She states she has been on Prozac and Klonopin before.  Will give dose of Ativan here and reassess.  She is concerned that she could have a vitamin deficiency.  Discussed with patient that these would not be labs that we would obtain from the emergency department as they would not require emergent treatment and this is something that would  be tested as an outpatient.  I will provide her with outpatient follow-up.  She states some of these labs were drawn at urgent care but she does not have the results yet and is hoping to get these results today.  ED PROGRESS  Patient's D-dimer is negative.  Second troponin negative.  Pregnancy test negative.  She reports feeling better after Ativan.  I feel she is safe to be discharged home with outpatient PCP follow-up.  I suspect that anxiety is playing a significant role and will discharge with prescription of Ativan to take as needed.  She is comfortable with this plan.   At this time, I do not feel there is any life-threatening condition present. I have reviewed, interpreted and discussed all results (EKG, imaging, lab, urine as appropriate) and exam findings with patient/family. I have  reviewed nursing notes and appropriate previous records.  I feel the patient is safe to be discharged home without further emergent workup and can continue workup as an outpatient as needed. Discussed usual and customary return precautions. Patient/family verbalize understanding and are comfortable with this plan.  Outpatient follow-up has been provided as needed. All questions have been answered.   ____________________________________________   FINAL CLINICAL IMPRESSION(S) / ED DIAGNOSES  Final diagnoses:  Atypical chest pain     ED Discharge Orders          Ordered    LORazepam (ATIVAN) 1 MG tablet  Every 8 hours PRN        04/10/21 0708            *Please note:  Jolie Strohecker was evaluated in Emergency Department on 04/10/2021 for the symptoms described in the history of present illness. She was evaluated in the context of the global COVID-19 pandemic, which necessitated consideration that the patient might be at risk for infection with the SARS-CoV-2 virus that causes COVID-19. Institutional protocols and algorithms that pertain to the evaluation of patients at risk for COVID-19 are in a state of rapid change based on information released by regulatory bodies including the CDC and federal and state organizations. These policies and algorithms were followed during the patient's care in the ED.  Some ED evaluations and interventions may be delayed as a result of limited staffing during and the pandemic.*   Note:  This document was prepared using Dragon voice recognition software and may include unintentional dictation errors.    Braelynn Lupton, Layla Maw, DO 04/10/21 [phone]

## 2021-04-10 NOTE — ED Triage Notes (Signed)
Pt in with co left arm tightness that goes into chest. Pt has had intermittent symptoms for the same since Thursday. Was seen here for the same Friday and was dx with Migraines. Pt here for worsening pain. Was also seen at urgent care yesterday but has not gotten results of blood work.

## 2021-04-10 NOTE — Discharge Instructions (Addendum)
Steps to find a Primary Care Provider (PCP):  Call [phone] or [phone] to access "[facility] Find a Doctor Service."  2.  You may also go on the [facility] website at [email or web address]   Your labs today are reassuring including normal cardiac labs and a negative D-dimer which rules out blood clots.  Her EKG and chest x-ray were normal.  I recommend close follow-up with her primary care physician if symptoms continue.

## 2021-04-13 ENCOUNTER — Other Ambulatory Visit: Payer: Self-pay | Admitting: Neurology

## 2021-04-13 ENCOUNTER — Other Ambulatory Visit (HOSPITAL_COMMUNITY): Payer: Self-pay | Admitting: Neurology

## 2021-04-13 DIAGNOSIS — G35 Multiple sclerosis: Secondary | ICD-10-CM

## 2021-04-14 ENCOUNTER — Other Ambulatory Visit: Payer: Self-pay

## 2021-04-14 ENCOUNTER — Ambulatory Visit
Admission: RE | Admit: 2021-04-14 | Discharge: 2021-04-14 | Disposition: A | Discharge status: Home / Self Care | Payer: BC Managed Care – PPO | Source: Ambulatory Visit | Attending: Neurology | Admitting: Neurology

## 2021-04-14 DIAGNOSIS — G35 Multiple sclerosis: Secondary | ICD-10-CM | POA: Diagnosis present

## 2021-04-14 IMAGING — MR MR HEAD WO/W CM
14 series · 48 of 48 positions shown · IV contrast (gadavist)
Comparison: Head CT [DATE]

CLINICAL DATA: Multiple sclerosis. Left-sided facial and arm
numbness beginning 2 weeks ago.

EXAM:
MRI HEAD WITHOUT AND WITH CONTRAST
TECHNIQUE: Multiplanar, multiecho pulse sequences of the brain and surrounding
structures were obtained without and with intravenous contrast.
CONTRAST:  7mL GADAVIST GADOBUTROL 1 MMOL/ML IV SOLN

[Series 5: ax dwi_tracew · axial · 3.0mm · 0.65mm/px · z∈[-117,+27]mm · 4 of 48 slices shown]
[im 1/48]
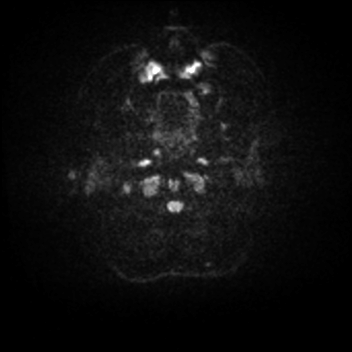
[im 16/48]
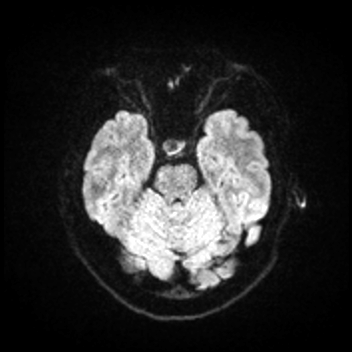
[im 32/48]
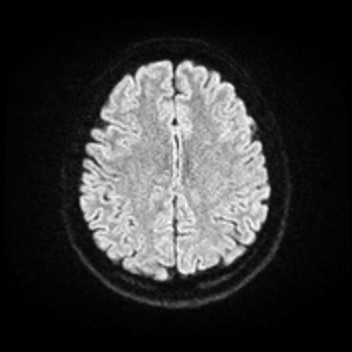
[im 48/48]
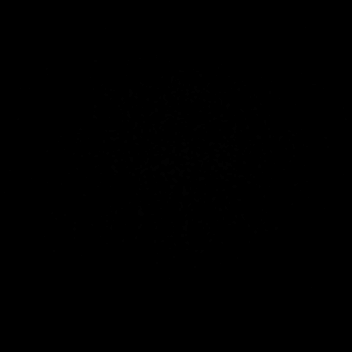

[Series 6: ax dwi_adc · axial · 3.0mm · 0.65mm/px · z∈[-117,+24]mm · 4 of 47 slices shown]
[im 1/47]
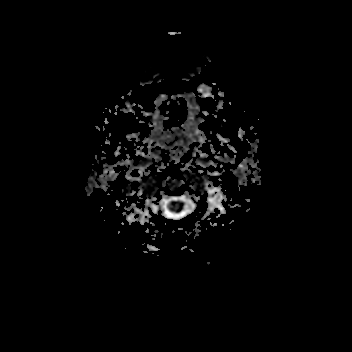
[im 16/47]
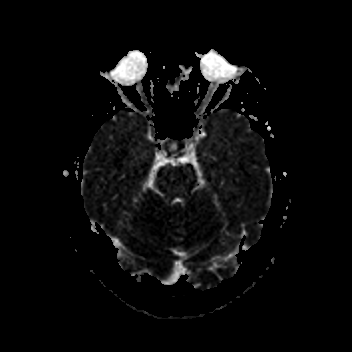
[im 31/47]
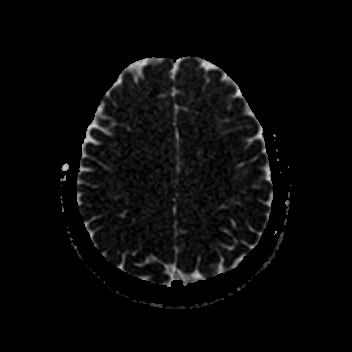
[im 47/47]
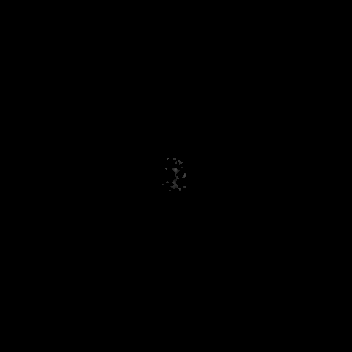

[Series 7: T1 · sagittal · 5.0mm · 0.62mm/px · 1 of 25 slices shown (1 of 2)]
[im 1/25]
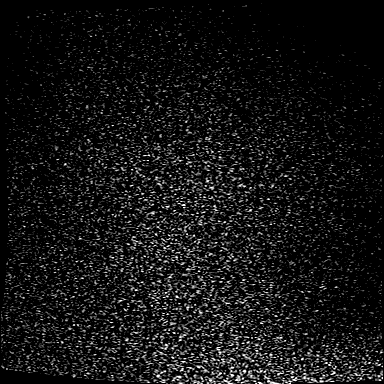

[Series 8: cor dwi_tracew · coronal · 5.0mm · 0.65mm/px · 2 of 38 slices shown]
[im 1/38]
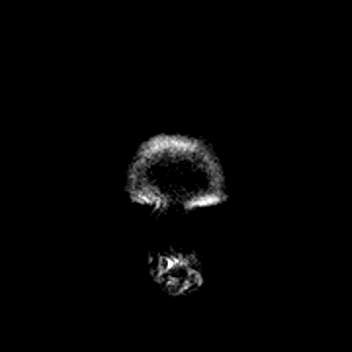
[im 38/38]
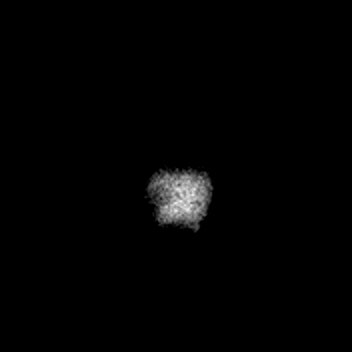

[Series 9: cor dwi_adc · coronal · 5.0mm · 0.65mm/px · 2 of 37 slices shown]
[im 1/37]
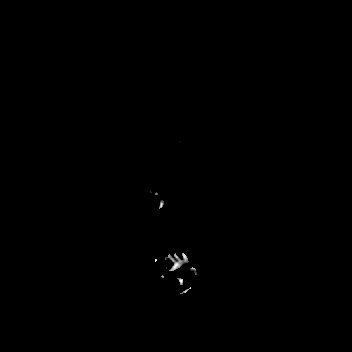
[im 37/37]
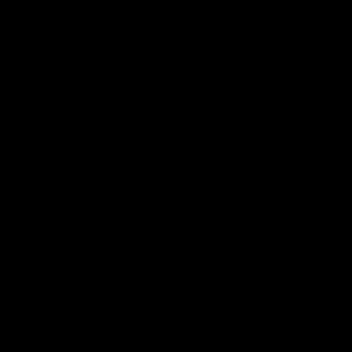

[Series 10: T2 · axial · 5.0mm · 0.53mm/px · 1 of 25 slices shown (1 of 2)]
[im 1/25]
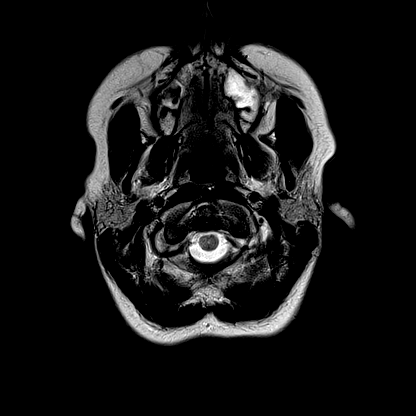

[Series 12: pha_images · axial · 3.0mm · 0.90mm/px · z∈[-127,+26]mm · 3 of 56 slices shown]
[im 1/56]
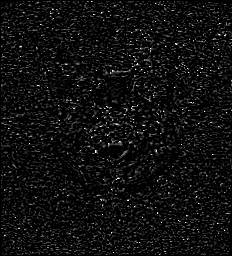
[im 28/56]
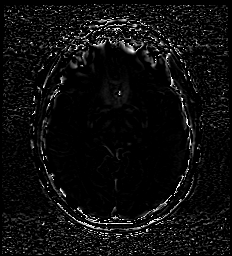
[im 56/56]
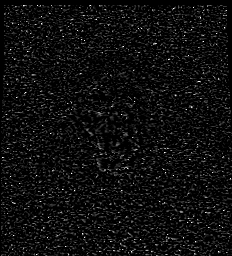

[Series 13: swi_images · axial · 3.0mm · 0.90mm/px · z∈[-127,+37]mm · 3 of 60 slices shown]
[im 1/60]
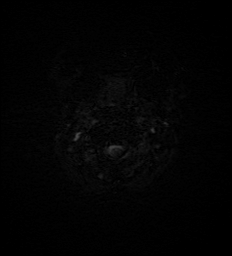
[im 30/60]
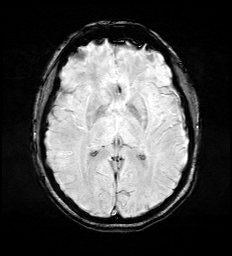
[im 60/60]
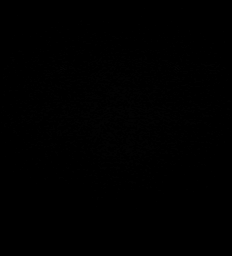

[Series 15: FLAIR · axial · 3.0mm · 0.53mm/px · z∈[-119,+32]mm · 3 of 55 slices shown (1 of 2)]
[im 1/55]
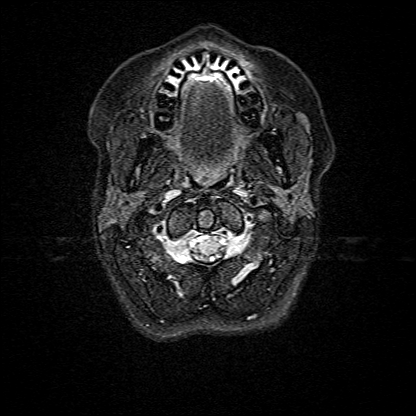
[im 28/55]
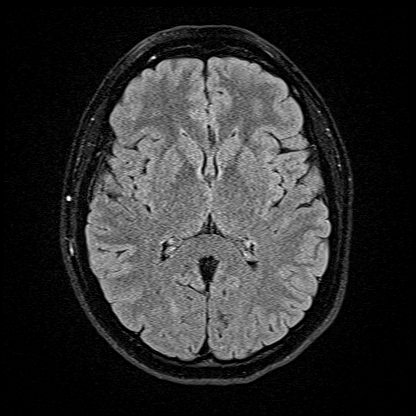
[im 55/55]
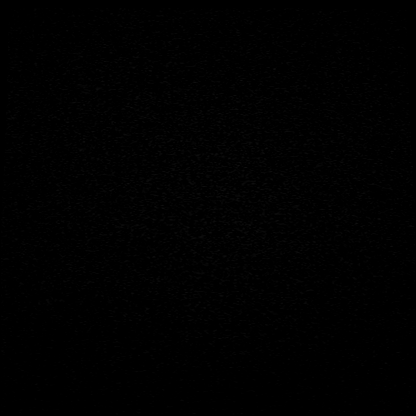

[Series 16: T1 · axial · 1.0mm · 0.98mm/px · z∈[-130,+31]mm · 10 of 175 slices shown (2 of 2)]
[im 1/175]
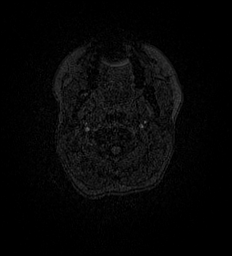
[im 20/175]
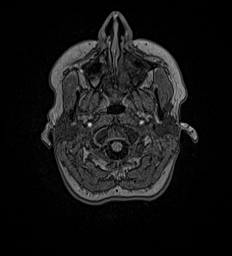
[im 39/175]
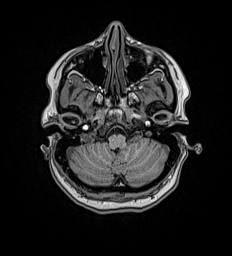
[im 59/175]
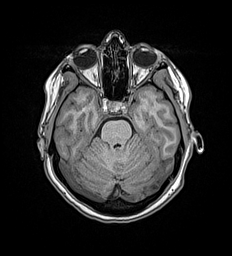
[im 78/175]
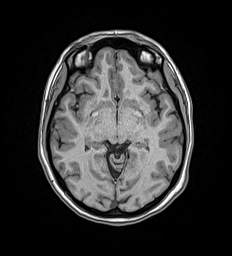
[im 97/175]
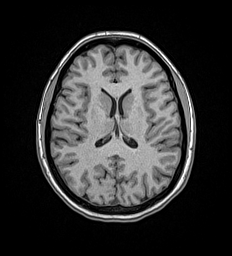
[im 117/175]
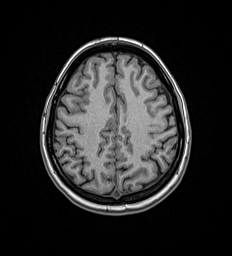
[im 136/175]
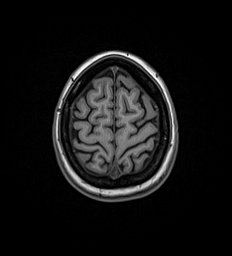
[im 155/175]
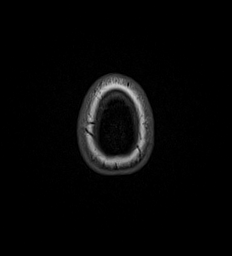
[im 175/175]
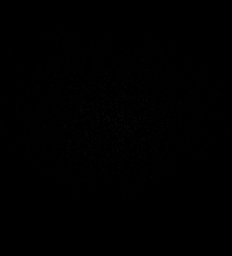

[Series 18: FLAIR · sagittal · 5.0mm · 0.94mm/px · 1 of 25 slices shown (2 of 2)]
[im 1/25]
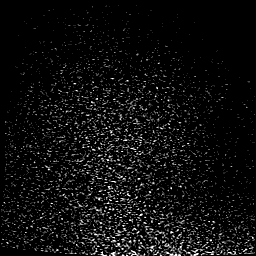

[Series 19: T2 · coronal · 5.0mm · 0.45mm/px · 2 of 31 slices shown (2 of 2)]
[im 1/31]
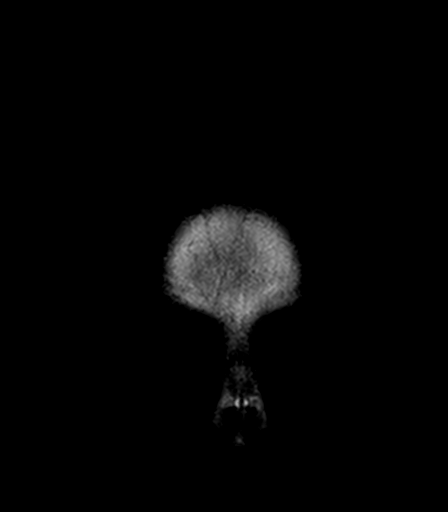
[im 31/31]
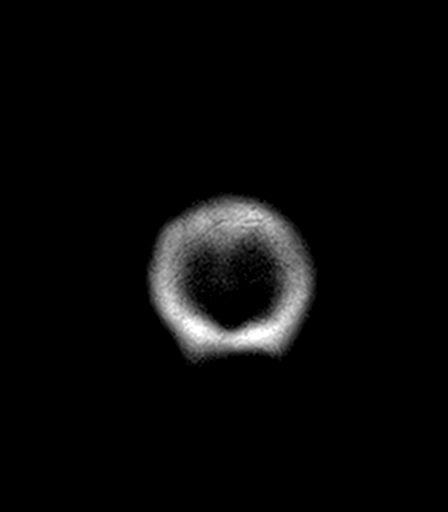

[Series 20: T1 post-contrast · axial · 1.0mm · 0.98mm/px · z∈[-130,+31]mm · 10 of 174 slices shown (1 of 2)]
[im 1/174]
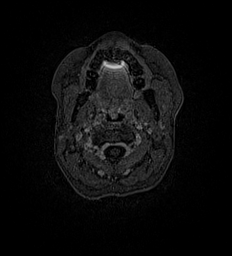
[im 20/174]
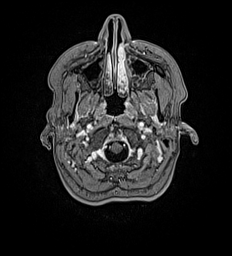
[im 39/174]
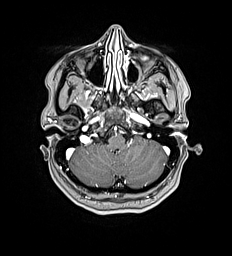
[im 58/174]
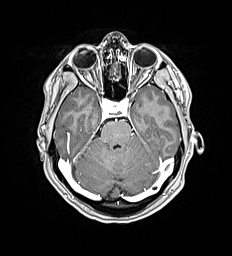
[im 77/174]
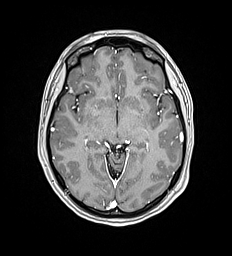
[im 97/174]
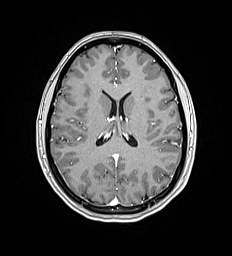
[im 116/174]
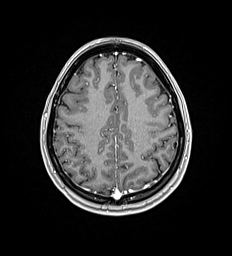
[im 135/174]
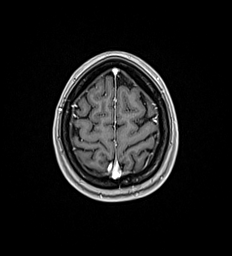
[im 154/174]
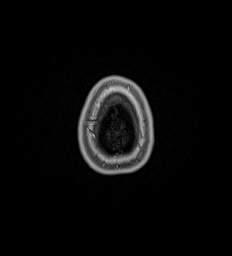
[im 174/174]
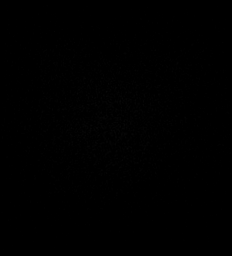

[Series 21: T1 post-contrast · coronal · 5.0mm · 0.90mm/px · 2 of 31 slices shown (2 of 2)]
[im 1/31]
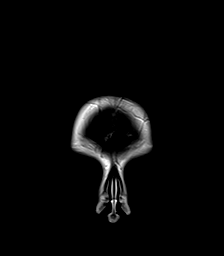
[im 31/31]
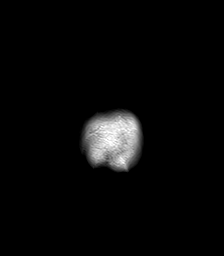

[48 of 48 positions shown; findings below may reference images not displayed]

FINDINGS: Brain: There is no evidence of an acute infarct, intracranial
hemorrhage, mass, midline shift, or extra-axial fluid collection.
The ventricles and sulci are normal. The brain is normal in signal.
No abnormal enhancement is identified. The cerebellar tonsils are
normally positioned.

Vascular: Major intracranial vascular flow voids are preserved.

Skull and upper cervical spine: Unremarkable bone marrow signal.

Sinuses/Orbits: Unremarkable orbits. Mild mucosal thickening in the
left greater than right maxillary and ethmoid sinuses. Clear mastoid
air cells.

Other: None.
IMPRESSION: Unremarkable appearance of the brain.

## 2021-04-14 MED ORDER — GADOBUTROL 1 MMOL/ML IV SOLN
7.0000 mL | Freq: Once | INTRAVENOUS | Status: AC | PRN
  Administered 2021-04-14: 7 mL via INTRAVENOUS

## 2021-04-19 ENCOUNTER — Ambulatory Visit: Payer: BC Managed Care – PPO

## 2021-09-10 ENCOUNTER — Emergency Department: Payer: BC Managed Care – PPO

## 2021-09-10 ENCOUNTER — Emergency Department
Admission: EM | Admit: 2021-09-10 | Discharge: 2021-09-10 | Disposition: A | Discharge status: Home / Self Care | Payer: BC Managed Care – PPO | Attending: Emergency Medicine | Admitting: Emergency Medicine

## 2021-09-10 ENCOUNTER — Other Ambulatory Visit: Payer: Self-pay

## 2021-09-10 DIAGNOSIS — R110 Nausea: Secondary | ICD-10-CM | POA: Diagnosis not present

## 2021-09-10 DIAGNOSIS — F419 Anxiety disorder, unspecified: Secondary | ICD-10-CM | POA: Insufficient documentation

## 2021-09-10 DIAGNOSIS — M5412 Radiculopathy, cervical region: Secondary | ICD-10-CM | POA: Diagnosis not present

## 2021-09-10 DIAGNOSIS — R519 Headache, unspecified: Secondary | ICD-10-CM | POA: Insufficient documentation

## 2021-09-10 LAB — BASIC METABOLIC PANEL
Anion gap: 5 (ref 5–15)
BUN: 11 mg/dL (ref 6–20)
CO2: 28 mmol/L (ref 22–32)
Calcium: 9.1 mg/dL (ref 8.9–10.3)
Chloride: 105 mmol/L (ref 98–111)
Creatinine, Ser: 0.74 mg/dL (ref 0.44–1.00)
GFR, Estimated: >60 mL/min (ref >60)
Glucose, Bld: 105 mg/dL — ABNORMAL HIGH (ref 70–99)
Potassium: 3.5 mmol/L (ref 3.5–5.1)
Sodium: 138 mmol/L (ref 135–145)

## 2021-09-10 LAB — CBC
HCT: 39.1 % (ref 36.0–46.0)
Hemoglobin: 13.2 g/dL (ref 12.0–15.0)
MCH: 30.4 pg (ref 26.0–34.0)
MCHC: 33.8 g/dL (ref 30.0–36.0)
MCV: 90.1 fL (ref 80.0–100.0)
Platelets: 289 10*3/uL (ref 150–400)
RBC: 4.34 MIL/uL (ref 3.87–5.11)
RDW: 13.5 % (ref 11.5–15.5)
WBC: 7.5 10*3/uL (ref 4.0–10.5)
nRBC: 0 % (ref 0.0–0.2)

## 2021-09-10 LAB — TROPONIN I (HIGH SENSITIVITY): Troponin I (High Sensitivity): <2 ng/L (ref <18)

## 2021-09-10 IMAGING — CR DG CHEST 2V
2 series · 2 of 2 positions shown · non-contrast
Comparison: Chest x-ray [DATE].

CLINICAL DATA: 30-year-old female with history of chest pain.

EXAM:
CHEST - 2 VIEW

[chest pa]
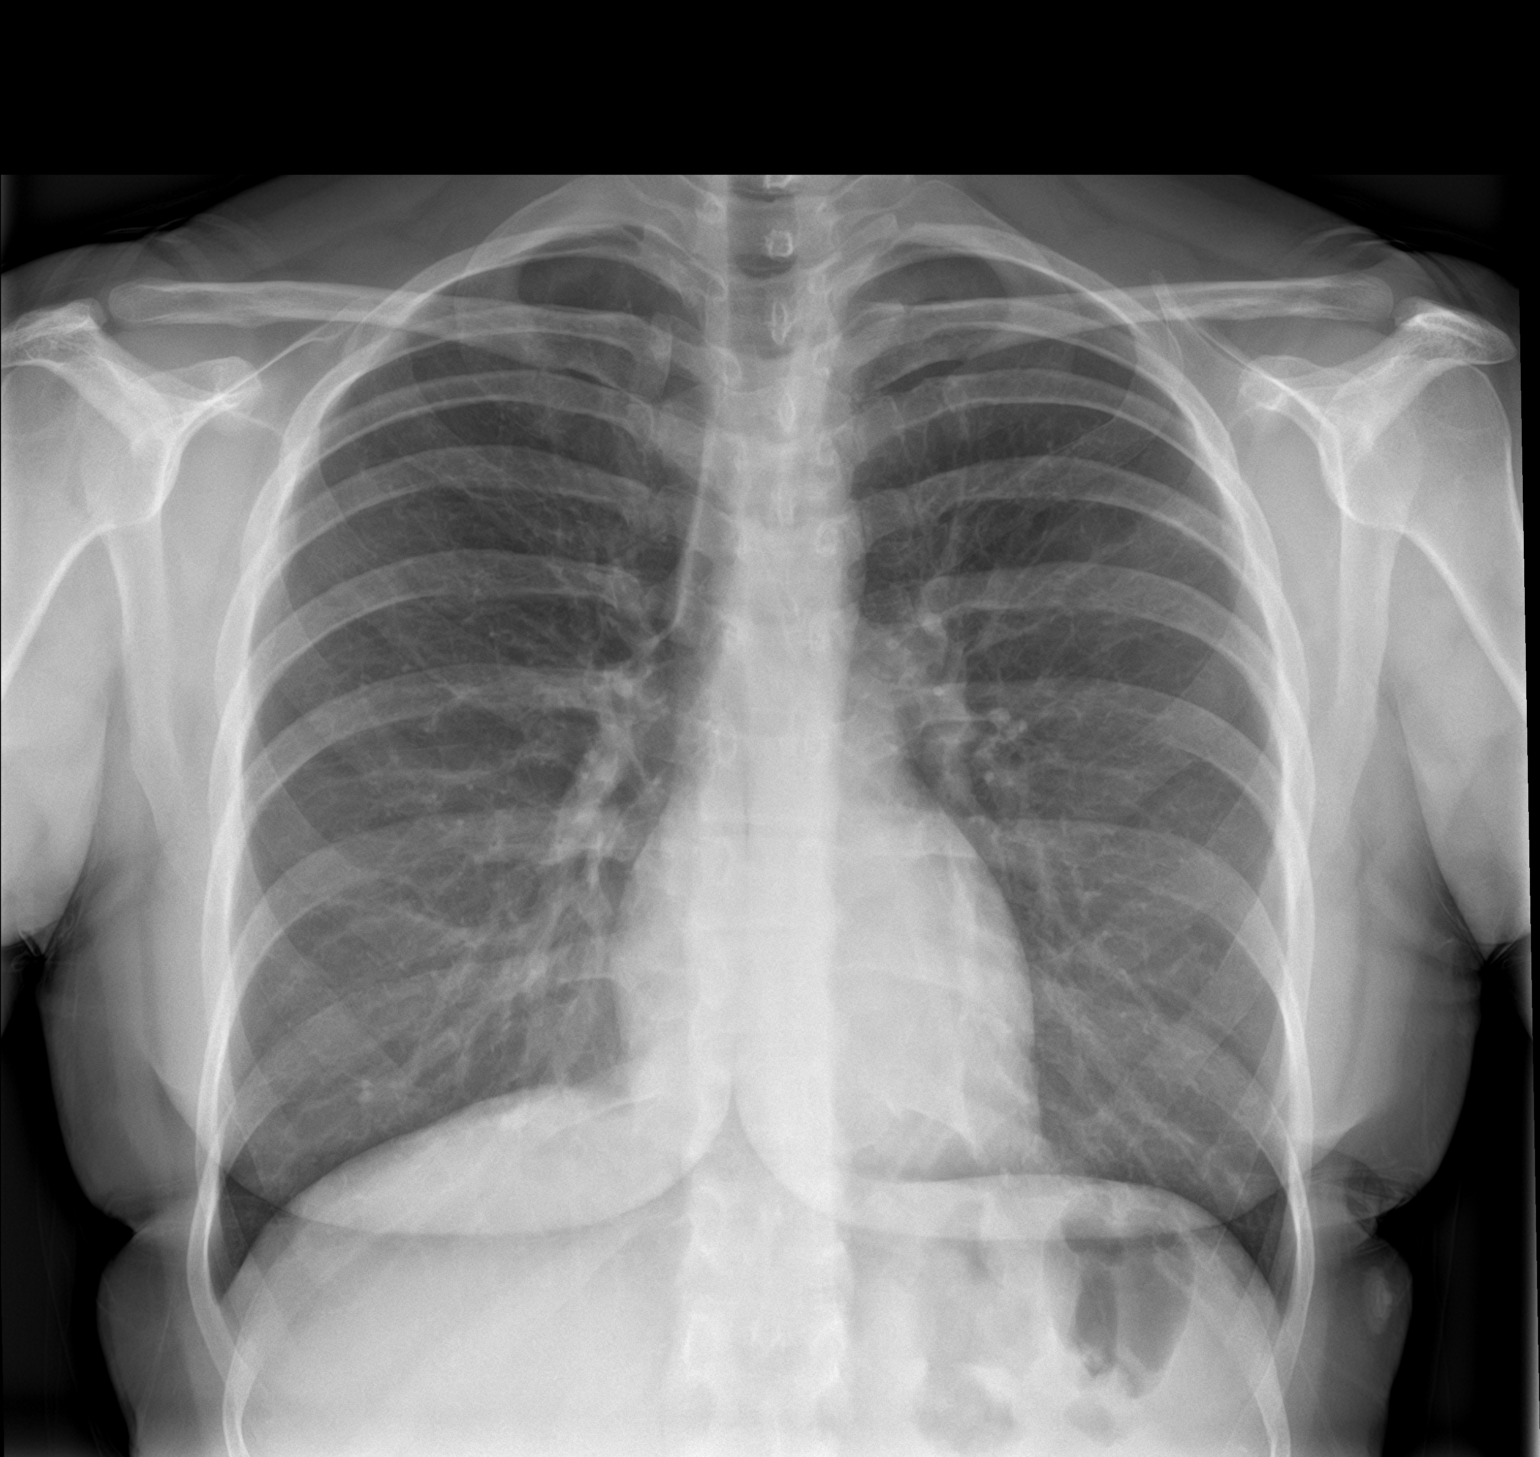

[chest lat]
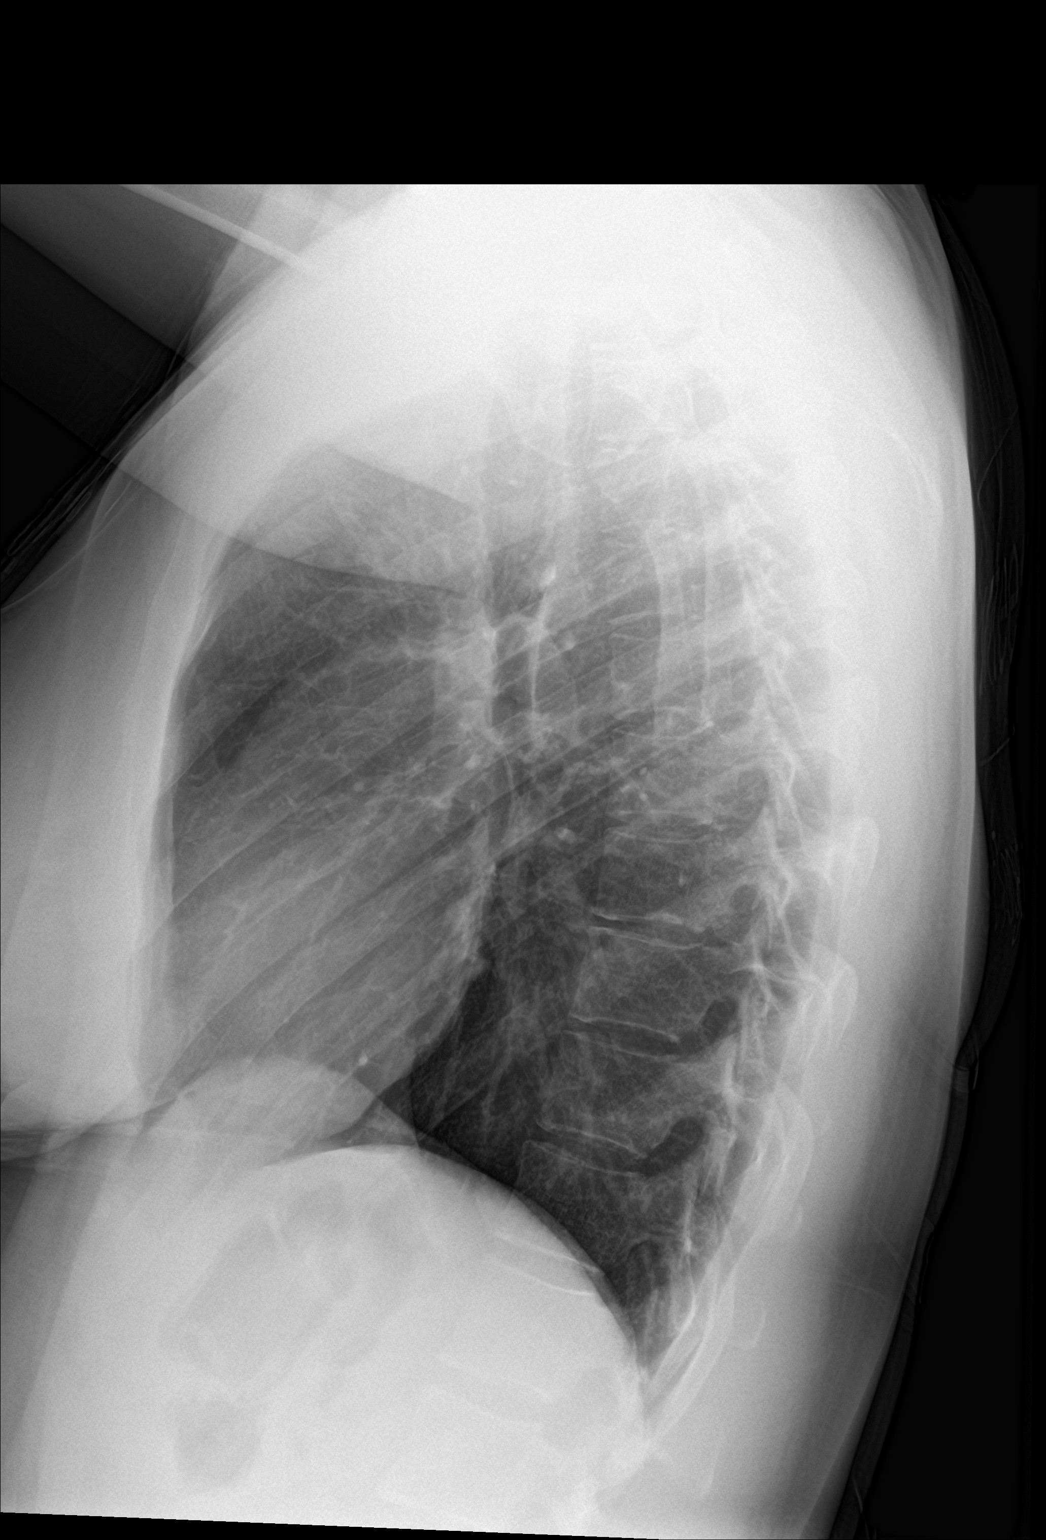

[2 of 2 positions shown; findings below may reference images not displayed]

FINDINGS: Lung volumes are normal. No consolidative airspace disease. No
pleural effusions. No pneumothorax. No pulmonary nodule or mass
noted. Pulmonary vasculature and the cardiomediastinal silhouette
are within normal limits.
IMPRESSION: No radiographic evidence of acute cardiopulmonary disease.

## 2021-09-10 IMAGING — CT CT ANGIO HEAD-NECK (W OR W/O PERF)
2 of 11 series · 6 of 34 positions shown · non-contrast
Comparison: Brain MRI [DATE]. Head and cervical spine CT
[DATE].

CLINICAL DATA: 30-year-old female with headache, left eye and face
pressure.

EXAM:
CT ANGIOGRAPHY HEAD AND NECK
TECHNIQUE: Multidetector CT imaging of the head and neck was performed using
the standard protocol during bolus administration of intravenous
contrast. Multiplanar CT image reconstructions and MIPs were
obtained to evaluate the vascular anatomy. Carotid stenosis
measurements (when applicable) are obtained utilizing NASCET
criteria, using the distal internal carotid diameter as the
denominator.

[Series 7: sagittal soft tissue · sagittal · 0.31mm/px · 1 of 52 slices shown]
[im 11/52  soft-tissue]
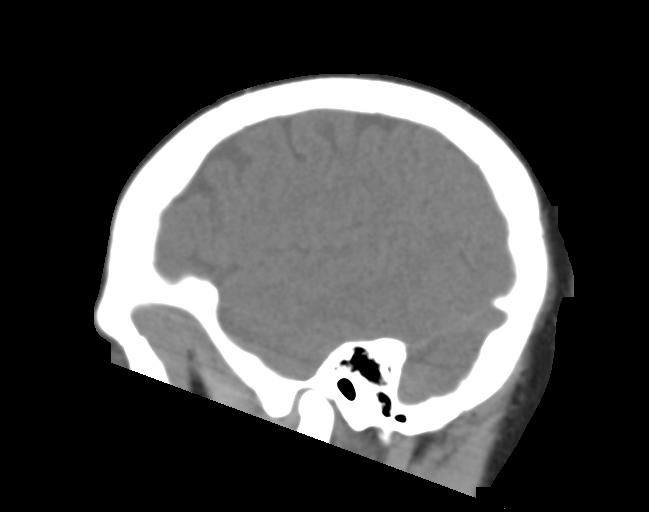

[Series 10: ax thin · axial · 0.52mm/px · z∈[-312,-80]mm · 5 of 362 slices shown]
[im 61/362  soft-tissue]
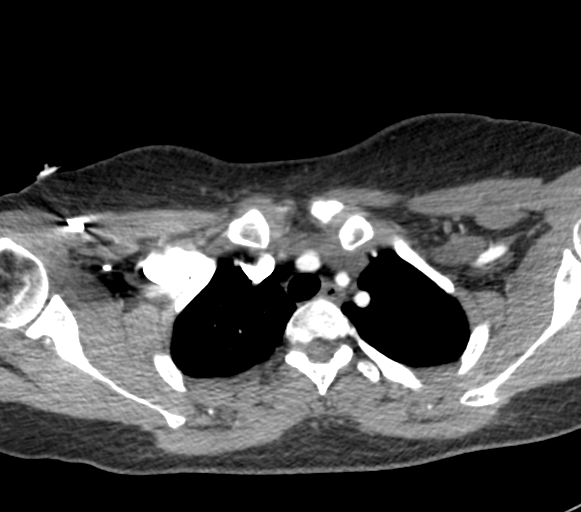
[im 121/362  bone]
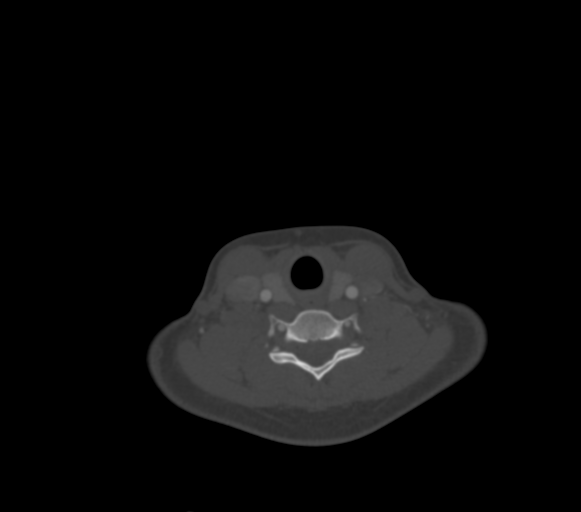
[im 181/362  soft-tissue]
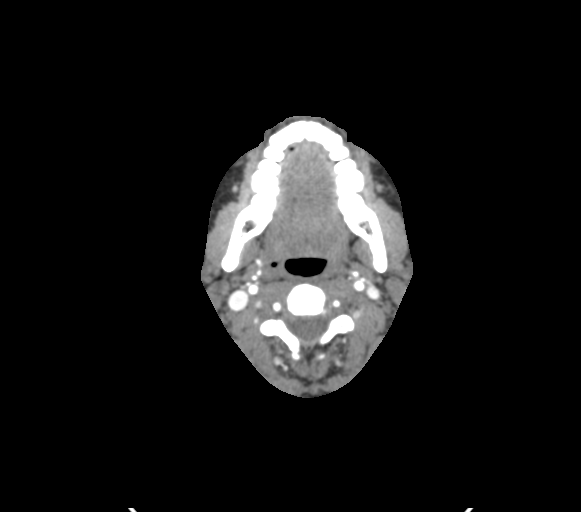
[im 241/362  bone]
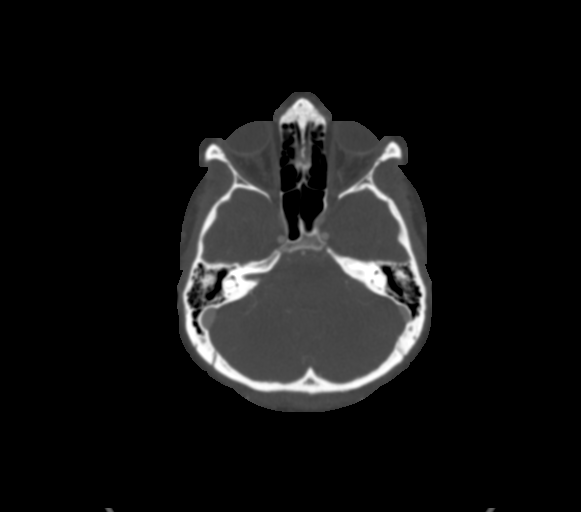
[im 301/362  soft-tissue]
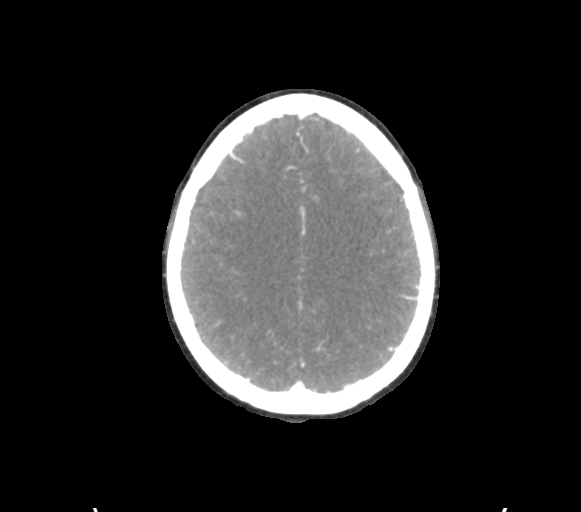

[6 of 34 positions shown; findings below may reference images not displayed]

RADIATION DOSE REDUCTION: This exam was performed according to the
departmental dose-optimization program which includes automated
exposure control, adjustment of the mA and/or kV according to
patient size and/or use of iterative reconstruction technique.

CONTRAST:  75mL OMNIPAQUE IOHEXOL 350 MG/ML SOLN
FINDINGS: CT HEAD

Brain: Normal cerebral volume. No midline shift, ventriculomegaly,
mass effect, evidence of mass lesion, intracranial hemorrhage or
evidence of cortically based acute infarction. Gray-white matter
differentiation is within normal limits throughout the brain.

Calvarium and skull base: Stable, negative.

Paranasal sinuses: Visualized paranasal sinuses and mastoids are
clear.

Orbits: Visualized orbits and scalp soft tissues are within normal
limits. Stable,

CTA NECK

Skeleton: On these images there is mild maxillary sinus alveolar
recess mucosal thickening greater on the left. Unremarkable CT
appearance of the cervical spine. No acute osseous abnormality
identified.

Upper chest: Negative.

Other neck: Negative.

Aortic arch: Normal.  Three vessel arch configuration.

Right carotid system: Normal.

Left carotid system: Normal.

Vertebral arteries:
Normal, the right vertebral artery appears somewhat dominant as on
the prior MRI.

CTA HEAD

Posterior circulation: Normal distal vertebral arteries and
vertebrobasilar junction. Mildly dominant right V4. Normal right
PICA and dominant appearing AICA origins. Diminutive but patent
basilar artery without stenosis. Fetal type bilateral PCA origins.
Patent SCA origins. Bilateral PCA branches are within normal limits.

Anterior circulation: Both ICA siphons are patent, normal. Normal
ophthalmic and posterior communicating artery origins. Patent
carotid termini. Normal MCA and ACA origins. Normal anterior
communicating artery. Bilateral ACA and MCA branches are within
normal limits.

Venous sinuses: Patent, several physiologic arachnoid granulations
are noted.

Anatomic variants: Mildly dominant right vertebral artery.

Review of the MIP images confirms the above findings
IMPRESSION: 1. Normal CTA head and neck.
2. Stable and normal CT appearance of the brain.
3. Mild maxillary sinus mucosal thickening, significance doubtful.

## 2021-09-10 MED ORDER — SODIUM CHLORIDE 0.9 % IV BOLUS
1000.0000 mL | Freq: Once | INTRAVENOUS | Status: AC
  Administered 2021-09-10: 1000 mL via INTRAVENOUS

## 2021-09-10 MED ORDER — CYCLOBENZAPRINE HCL 5 MG PO TABS: ORAL_TABLET | ORAL | 0 refills | Status: DC

## 2021-09-10 MED ORDER — METOCLOPRAMIDE HCL 5 MG/ML IJ SOLN
5.0000 mg | Freq: Once | INTRAMUSCULAR | Status: AC
  Administered 2021-09-10: 5 mg via INTRAVENOUS
  Filled 2021-09-10: qty 2

## 2021-09-10 MED ORDER — CLONAZEPAM 1 MG PO TABS: 1.0000 mg | ORAL_TABLET | Freq: Every day | ORAL | 0 refills | Status: DC | PRN

## 2021-09-10 MED ORDER — IOHEXOL 350 MG/ML SOLN
75.0000 mL | Freq: Once | INTRAVENOUS | Status: AC | PRN
  Administered 2021-09-10: 75 mL via INTRAVENOUS

## 2021-09-10 MED ORDER — DIAZEPAM 5 MG/ML IJ SOLN
2.0000 mg | Freq: Once | INTRAMUSCULAR | Status: AC
  Administered 2021-09-10: 2 mg via INTRAVENOUS
  Filled 2021-09-10: qty 2

## 2021-09-10 MED ORDER — BUTALBITAL-APAP-CAFFEINE 50-325-40 MG PO TABS: 1.0000 | ORAL_TABLET | Freq: Four times a day (QID) | ORAL | 0 refills | Status: DC | PRN

## 2021-09-10 NOTE — ED Notes (Signed)
Pt discharged to home. Escorted to waiting room to wait for husband to transport home. Pt educated on risks associated with driving after medications received in ED as they can cause drowsiness, delayed reaction time, and altered thinking. Pt verbalizes understanding. Iv removed, cath intact. Pressure dressing applied. No bleeding noted at site.

## 2021-09-10 NOTE — ED Triage Notes (Signed)
Pt states she feels like she has pressure in left eye, tingling in left side of face, has tension in her left neck, pain in left arm, and left leg "feels funny".  Pt states she also has a worsening headache and chest pain. Pt states symptoms have been having symptoms intermittently for several months, were better after IUD was taken out, and then started worsening a few days ago.

## 2021-09-10 NOTE — Discharge Instructions (Addendum)
1.  You may take Fioricet as needed for headaches. 2.  You may take Flexeril as needed for neck pain. 3.  Take Clonazepam 1/2 to 1 tablet daily as needed for anxiety. 4.  Apply moist heat to affected area several times daily to reduce discomfort. 5.  Return to the ER for worsening symptoms, persistent vomiting, lethargy or other concerns.

## 2021-09-10 NOTE — ED Provider Notes (Signed)
[facility] Provider Note    Event Date/Time   First MD Initiated Contact with Patient 09/10/21 [phone]     (approximate)   History   left eye pressure   HPI  Aimee Jackson is a 31 y.o. female who presents to the ED from home with a chief complaint of headache, neck pain, pressure behind her left eye, funny sensation in her left arm and leg.  States all of the symptoms started in June 2021.  She had a normal MRI of the brain in August 2021 as well as neurology evaluation who dismissed her from clinic given normal MRI.  She has had flareups from time to time, most recently several weeks ago.  She saw her PCP in the office on 08/27/2021 and was placed on prednisone for cervical radiculopathy.  She has also been to see a chiropractor for adjustments; last visited 2 weeks ago.  In October 2020 when she had her copper IUD removed to see if that would improve her symptoms.  Reports frequent headaches; currently has a headache on her left side associated with nausea but without photophobia.  Denies fever, cough, chest pain, shortness of breath, abdominal pain, vomiting or dizziness.  Denies recent fall/trauma/injury.  She is upset and frustrated because no one has given her answers and has had to take her Klonopin more frequently this week.  Denies additional stressors this week.     Past Medical History   Past Medical History:  Diagnosis Date   Anxiety      Active Problem List  There are no problems to display for this patient.    Past Surgical History  History reviewed. No pertinent surgical history.   Home Medications   Prior to Admission medications   Medication Sig Start Date End Date Taking? Authorizing Provider  butalbital-acetaminophen-caffeine (FIORICET) 50-325-40 MG tablet Take 1 tablet by mouth every 6 (six) hours as needed for headache. 09/10/21  Yes Irean Hong, MD  clonazePAM (KLONOPIN) 1 MG tablet Take 1 tablet (1 mg total) by mouth daily as  needed for up to 15 days for anxiety. 09/10/21 09/25/21 Yes Irean Hong, MD  cyclobenzaprine (FLEXERIL) 5 MG tablet 1 tablet every 8 hours as needed for muscle spasms 09/10/21  Yes Irean Hong, MD  LORazepam (ATIVAN) 1 MG tablet Take 1 tablet (1 mg total) by mouth every 8 (eight) hours as needed for anxiety. 04/10/21 04/10/22  Ward, Layla Maw, DO     Allergies  Aleve [naproxen] and Codeine   Family History  History reviewed. No pertinent family history.   Physical Exam  Triage Vital Signs: ED Triage Vitals  Enc Vitals Group     BP 09/10/21 0349 (!) 129/92     Pulse Rate 09/10/21 0349 87     Resp 09/10/21 0349 16     Temp 09/10/21 0349 99.1 F (37.3 C)     Temp Source 09/10/21 0349 Oral     SpO2 09/10/21 0349 100 %     Weight 09/10/21 0350 165 lb (74.8 kg)     Height 09/10/21 0350  (1.651 m)     Head Circumference --      Peak Flow --      Pain Score 09/10/21 0349 2     Pain Loc --      Pain Edu? --      Excl. in GC? --     Updated Vital Signs: BP (!) 129/92 (BP Location: Left Arm)  Pulse 87    Temp 99.1 F (37.3 C) (Oral)    Resp 16    Ht 5\' 5"  (1.651 m)    Wt 74.8 kg    LMP 08/28/2021 (Exact Date)    SpO2 100%    BMI 27.46 kg/m    General: Awake, tearful, anxious.  CV:  RRR. Good peripheral perfusion.  Resp:  Normal effort.  CTA B. Abd:  Nontender.  No distention.  Other:  Alert and oriented x3.  CN II-XII grossly intact.  5/5 motor strength and sensation all extremities.  PERRL.  EOMI.  No carotid bruits.  Supple neck without meningismus.   ED Results / Procedures / Treatments  Labs (all labs ordered are listed, but only abnormal results are displayed) Labs Reviewed  BASIC METABOLIC PANEL - Abnormal; Notable for the following components:      Result Value   Glucose, Bld 105 (*)    All other components within normal limits  CBC  POC URINE PREG, ED  TROPONIN I (HIGH SENSITIVITY)     EKG  ED ECG REPORT I, Benjamin Casanas J, the attending physician,  personally viewed and interpreted this ECG.   Date: 09/10/2021  EKG Time: 0359  Rate: 80  Rhythm: normal EKG, normal sinus rhythm, unchanged from previous tracings, normal sinus rhythm  Axis: Normal  Intervals:right bundle branch block  ST&T Change: Nonspecific    RADIOLOGY I have personally reviewed patient's chest x-ray, CTA head/neck as well as the radiology interpretations:  Chest x-ray: No acute cardiopulmonary process  CTA head/neck: No acute abnormality  Official radiology report(s): CT ANGIO HEAD NECK W WO CM  Result Date: 09/10/2021 CLINICAL DATA:  31 year old female with headache, left eye and face pressure. EXAM: CT ANGIOGRAPHY HEAD AND NECK TECHNIQUE: Multidetector CT imaging of the head and neck was performed using the standard protocol during bolus administration of intravenous contrast. Multiplanar CT image reconstructions and MIPs were obtained to evaluate the vascular anatomy. Carotid stenosis measurements (when applicable) are obtained utilizing NASCET criteria, using the distal internal carotid diameter as the denominator. RADIATION DOSE REDUCTION: This exam was performed according to the departmental dose-optimization program which includes automated exposure control, adjustment of the mA and/or kV according to patient size and/or use of iterative reconstruction technique. CONTRAST:  59mL OMNIPAQUE IOHEXOL 350 MG/ML SOLN COMPARISON:  Brain MRI 04/14/2021. Head and cervical spine CT 04/06/2021. FINDINGS: CT HEAD Brain: Normal cerebral volume. No midline shift, ventriculomegaly, mass effect, evidence of mass lesion, intracranial hemorrhage or evidence of cortically based acute infarction. Gray-white matter differentiation is within normal limits throughout the brain. Calvarium and skull base: Stable, negative. Paranasal sinuses: Visualized paranasal sinuses and mastoids are clear. Orbits: Visualized orbits and scalp soft tissues are within normal limits. Stable, CTA NECK  Skeleton: On these images there is mild maxillary sinus alveolar recess mucosal thickening greater on the left. Unremarkable CT appearance of the cervical spine. No acute osseous abnormality identified. Upper chest: Negative. Other neck: Negative. Aortic arch: Normal.  Three vessel arch configuration. Right carotid system: Normal. Left carotid system: Normal. Vertebral arteries: Normal, the right vertebral artery appears somewhat dominant as on the prior MRI. CTA HEAD Posterior circulation: Normal distal vertebral arteries and vertebrobasilar junction. Mildly dominant right V4. Normal right PICA and dominant appearing AICA origins. Diminutive but patent basilar artery without stenosis. Fetal type bilateral PCA origins. Patent SCA origins. Bilateral PCA branches are within normal limits. Anterior circulation: Both ICA siphons are patent, normal. Normal ophthalmic and posterior communicating artery origins. Patent  carotid termini. Normal MCA and ACA origins. Normal anterior communicating artery. Bilateral ACA and MCA branches are within normal limits. Venous sinuses: Patent, several physiologic arachnoid granulations are noted. Anatomic variants: Mildly dominant right vertebral artery. Review of the MIP images confirms the above findings IMPRESSION: 1. Normal CTA head and neck. 2. Stable and normal CT appearance of the brain. 3. Mild maxillary sinus mucosal thickening, significance doubtful. Electronically Signed   By: Odessa Fleming M.D.   On: 09/10/2021 05:26   DG Chest 2 View  Result Date: 09/10/2021 CLINICAL DATA:  31 year old female with history of chest pain. EXAM: CHEST - 2 VIEW COMPARISON:  Chest x-ray 04/10/2021. FINDINGS: Lung volumes are normal. No consolidative airspace disease. No pleural effusions. No pneumothorax. No pulmonary nodule or mass noted. Pulmonary vasculature and the cardiomediastinal silhouette are within normal limits. IMPRESSION: No radiographic evidence of acute cardiopulmonary disease.  Electronically Signed   By: Trudie Reed M.D.   On: 09/10/2021 05:01     PROCEDURES:  Critical Care performed: No  .1-3 Lead EKG Interpretation Performed by: Irean Hong, MD Authorized by: Irean Hong, MD     Interpretation: normal     ECG rate:  87   ECG rate assessment: normal     Rhythm: sinus rhythm     Ectopy: none     Conduction: normal   Comments:     Patient placed on cardiac monitor to evaluate for arrhythmias  NIH Stroke Scale  Interval: Baseline Time: 6:50 AM Person Administering Scale: Chairty Toman J  Administer stroke scale items in the order listed. Record performance in each category after each subscale exam. Do not go back and change scores. Follow directions provided for each exam technique. Scores should reflect what the patient does, not what the clinician thinks the patient can do. The clinician should record answers while administering the exam and work quickly. Except where indicated, the patient should not be coached (i.e., repeated requests to patient to make a special effort).   1a  Level of consciousness: 0=alert; keenly responsive  1b. LOC questions:  0=Performs both tasks correctly  1c. LOC commands: 0=Performs both tasks correctly  2.  Best Gaze: 0=normal  3.  Visual: 0=No visual loss  4. Facial Palsy: 0=Normal symmetric movement  5a.  Motor left arm: 0=No drift, limb holds 90 (or 45) degrees for full 10 seconds  5b.  Motor right arm: 0=No drift, limb holds 90 (or 45) degrees for full 10 seconds  6a. motor left leg: 0=No drift, limb holds 90 (or 45) degrees for full 10 seconds  6b  Motor right leg:  0=No drift, limb holds 90 (or 45) degrees for full 10 seconds  7. Limb Ataxia: 0=Absent  8.  Sensory: 0=Normal; no sensory loss  9. Best Language:  0=No aphasia, normal  10. Dysarthria: 0=Normal  11. Extinction and Inattention: 0=No abnormality  12. Distal motor function: 0=Normal   Total:   0     MEDICATIONS ORDERED IN ED: Medications   sodium chloride 0.9 % bolus 1,000 mL (1,000 mLs Intravenous New Bag/Given 09/10/21 0522)  metoCLOPramide (REGLAN) injection 5 mg (5 mg Intravenous Given 09/10/21 0528)  diazepam (VALIUM) injection 2 mg (2 mg Intravenous Given 09/10/21 0523)  iohexol (OMNIPAQUE) 350 MG/ML injection 75 mL (75 mLs Intravenous Contrast Given 09/10/21 0459)     IMPRESSION / MDM / ASSESSMENT AND PLAN / ED COURSE  I reviewed the triage vital signs and the nursing notes.  31 year old female presenting with a several month history of headache and paresthesias. Differential diagnosis includes, but is not limited to, intracranial hemorrhage, meningitis/encephalitis, previous head trauma, cavernous venous thrombosis, tension headache, temporal arteritis, migraine or migraine equivalent, idiopathic intracranial hypertension, and non-specific headache.  I have personally reviewed patient's chart and note her neurology office visit from 04/12/2021, 05/02/2021 as well as her MRI brain from 04/14/2021.  I have also reviewed patient's most recent PCP office visit from 08/27/2021 for cervical radiculopathy.  The patient is on the cardiac monitor to evaluate for evidence of arrhythmia and/or significant heart rate changes.  Laboratory results demonstrate normal WBC, normal electrolytes, negative troponin.  Chest x-ray unremarkable.  Will obtain CTA head and neck, initiate treatment with IV fluids, Reglan and Diazepam.  Patient does not have focal neurological deficits on examination.  Will reassess.  Clinical Course as of 09/10/21 0650  Mon Sep 10, 2021  [id number] Patient feeling better.  Headache and tearfulness improved.  Updated patient on CT imaging results.  Will discharge home with prescription for as needed Fioricet and Flexeril.  Will increase Clonazepam dosage.  She will follow back up with her neurologist.  Strict return precautions given.  Patient verbalizes understanding and agrees with plan of care. [JS]     Clinical Course User Index [JS] Irean Hong, MD     FINAL CLINICAL IMPRESSION(S) / ED DIAGNOSES   Final diagnoses:  Acute nonintractable headache, unspecified headache type  Cervical radiculopathy  Anxiety     Rx / DC Orders   ED Discharge Orders          Ordered    butalbital-acetaminophen-caffeine (FIORICET) 50-325-40 MG tablet  Every 6 hours PRN        09/10/21 0550    cyclobenzaprine (FLEXERIL) 5 MG tablet        09/10/21 0550    clonazePAM (KLONOPIN) 1 MG tablet  Daily PRN        09/10/21 1157             Note:  This document was prepared using Dragon voice recognition software and may include unintentional dictation errors.   Irean Hong, MD 09/10/21 [phone]

## 2021-10-29 ENCOUNTER — Emergency Department: Payer: BC Managed Care – PPO

## 2021-10-29 ENCOUNTER — Other Ambulatory Visit: Payer: Self-pay

## 2021-10-29 ENCOUNTER — Emergency Department
Admission: EM | Admit: 2021-10-29 | Discharge: 2021-10-29 | Disposition: A | Discharge status: Home / Self Care | Payer: BC Managed Care – PPO | Attending: Emergency Medicine | Admitting: Emergency Medicine

## 2021-10-29 DIAGNOSIS — R091 Pleurisy: Secondary | ICD-10-CM | POA: Insufficient documentation

## 2021-10-29 DIAGNOSIS — R5383 Other fatigue: Secondary | ICD-10-CM | POA: Insufficient documentation

## 2021-10-29 DIAGNOSIS — R0789 Other chest pain: Secondary | ICD-10-CM

## 2021-10-29 LAB — CBC
HCT: 37.2 % (ref 36.0–46.0)
Hemoglobin: 12.3 g/dL (ref 12.0–15.0)
MCH: 30.2 pg (ref 26.0–34.0)
MCHC: 33.1 g/dL (ref 30.0–36.0)
MCV: 91.4 fL (ref 80.0–100.0)
Platelets: 278 10*3/uL (ref 150–400)
RBC: 4.07 MIL/uL (ref 3.87–5.11)
RDW: 14.1 % (ref 11.5–15.5)
WBC: 5.2 10*3/uL (ref 4.0–10.5)
nRBC: 0 % (ref 0.0–0.2)

## 2021-10-29 LAB — TROPONIN I (HIGH SENSITIVITY): Troponin I (High Sensitivity): 2 ng/L (ref <18)

## 2021-10-29 LAB — BASIC METABOLIC PANEL
Anion gap: 5 (ref 5–15)
BUN: 20 mg/dL (ref 6–20)
CO2: 27 mmol/L (ref 22–32)
Calcium: 8.3 mg/dL — ABNORMAL LOW (ref 8.9–10.3)
Chloride: 107 mmol/L (ref 98–111)
Creatinine, Ser: 1.04 mg/dL — ABNORMAL HIGH (ref 0.44–1.00)
GFR, Estimated: >60 mL/min (ref >60)
Glucose, Bld: 92 mg/dL (ref 70–99)
Potassium: 4 mmol/L (ref 3.5–5.1)
Sodium: 139 mmol/L (ref 135–145)

## 2021-10-29 LAB — D-DIMER, QUANTITATIVE: D-Dimer, Quant: 0.29 ug/mL-FEU (ref 0.00–0.50)

## 2021-10-29 LAB — MAGNESIUM: Magnesium: 1.9 mg/dL (ref 1.7–2.4)

## 2021-10-29 LAB — VITAMIN B12: Vitamin B-12: 305 pg/mL (ref 180–914)

## 2021-10-29 IMAGING — CR DG CHEST 2V
2 series · 2 of 2 positions shown · non-contrast
Comparison: [DATE]

CLINICAL DATA: Chest pain

EXAM:
CHEST - 2 VIEW

[chest pa]
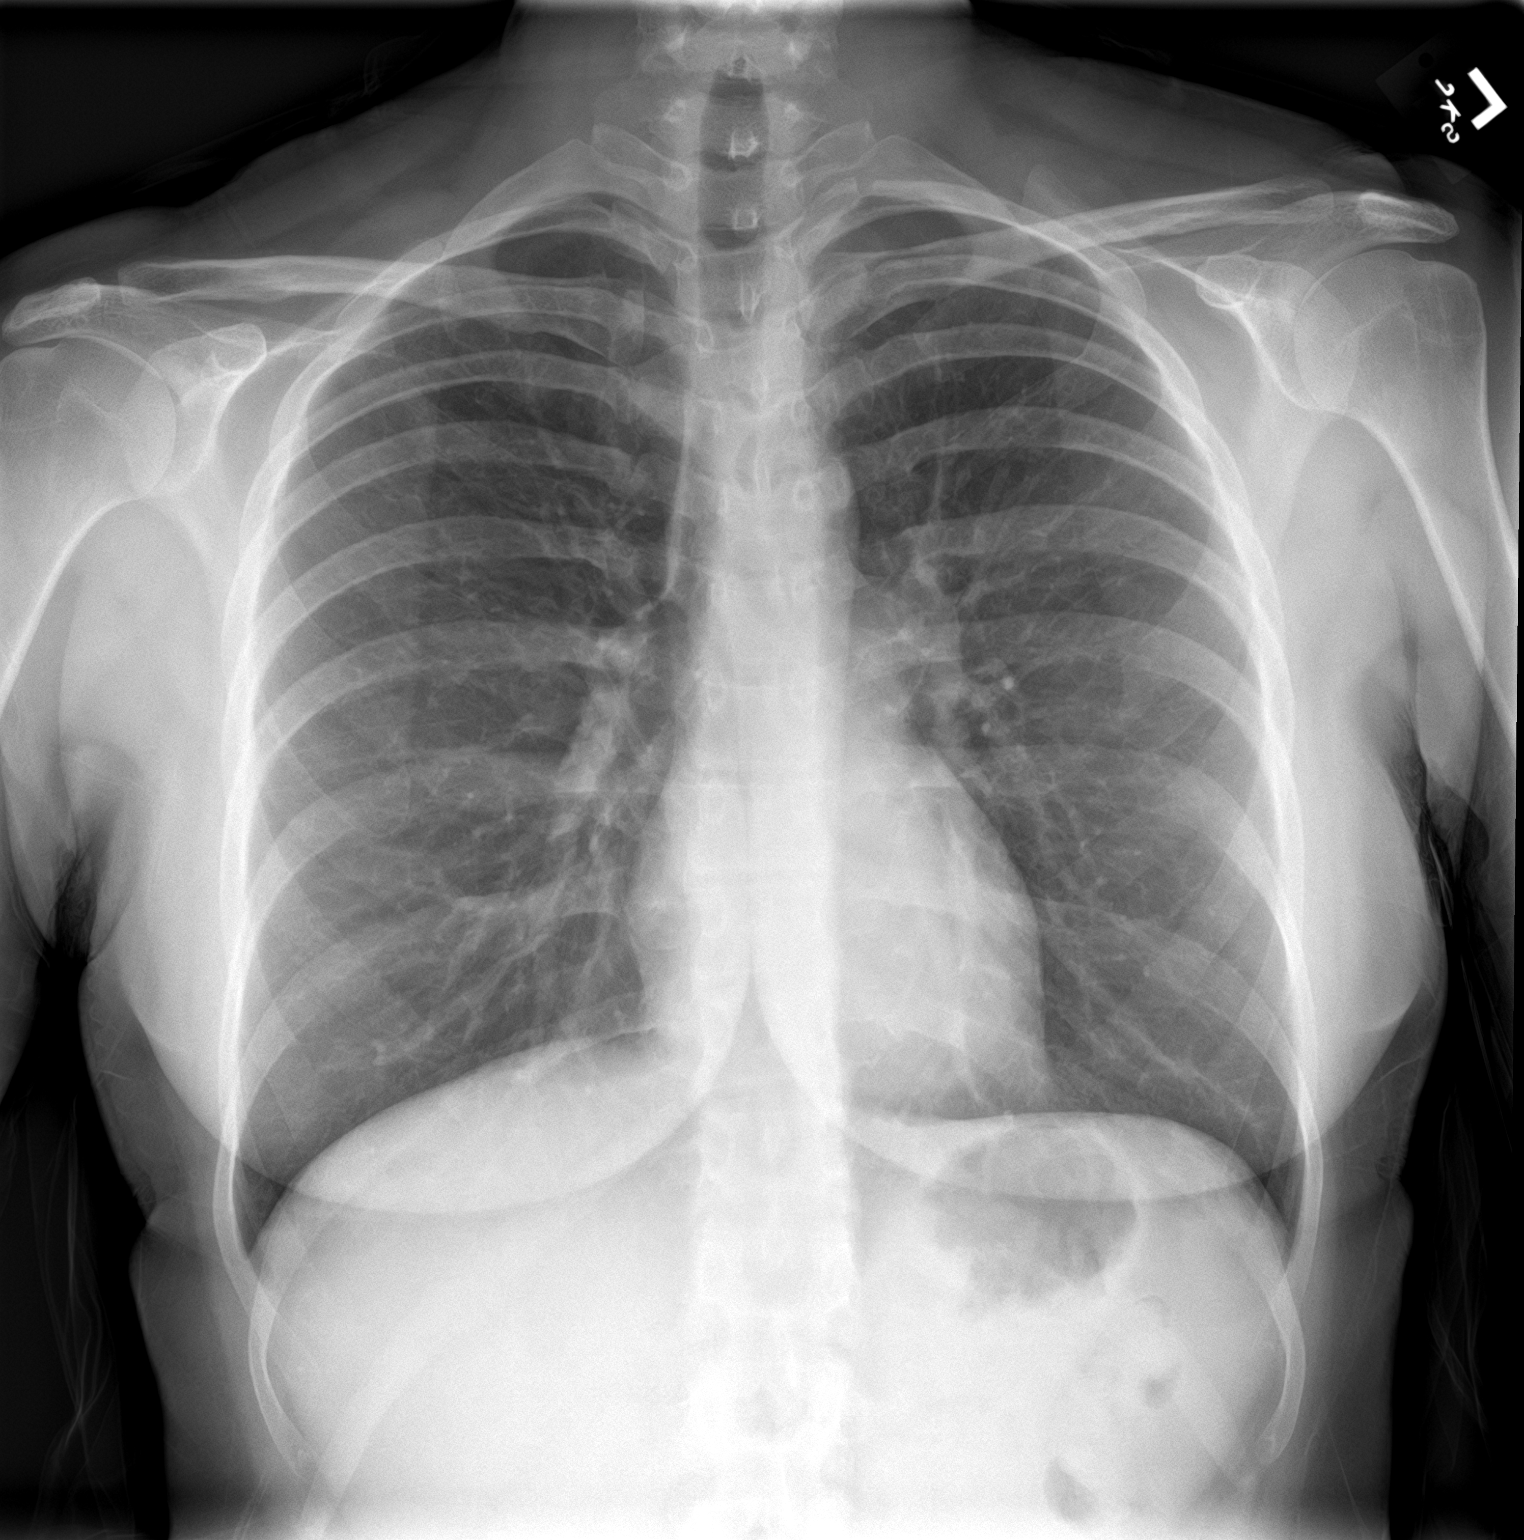

[chest lat]
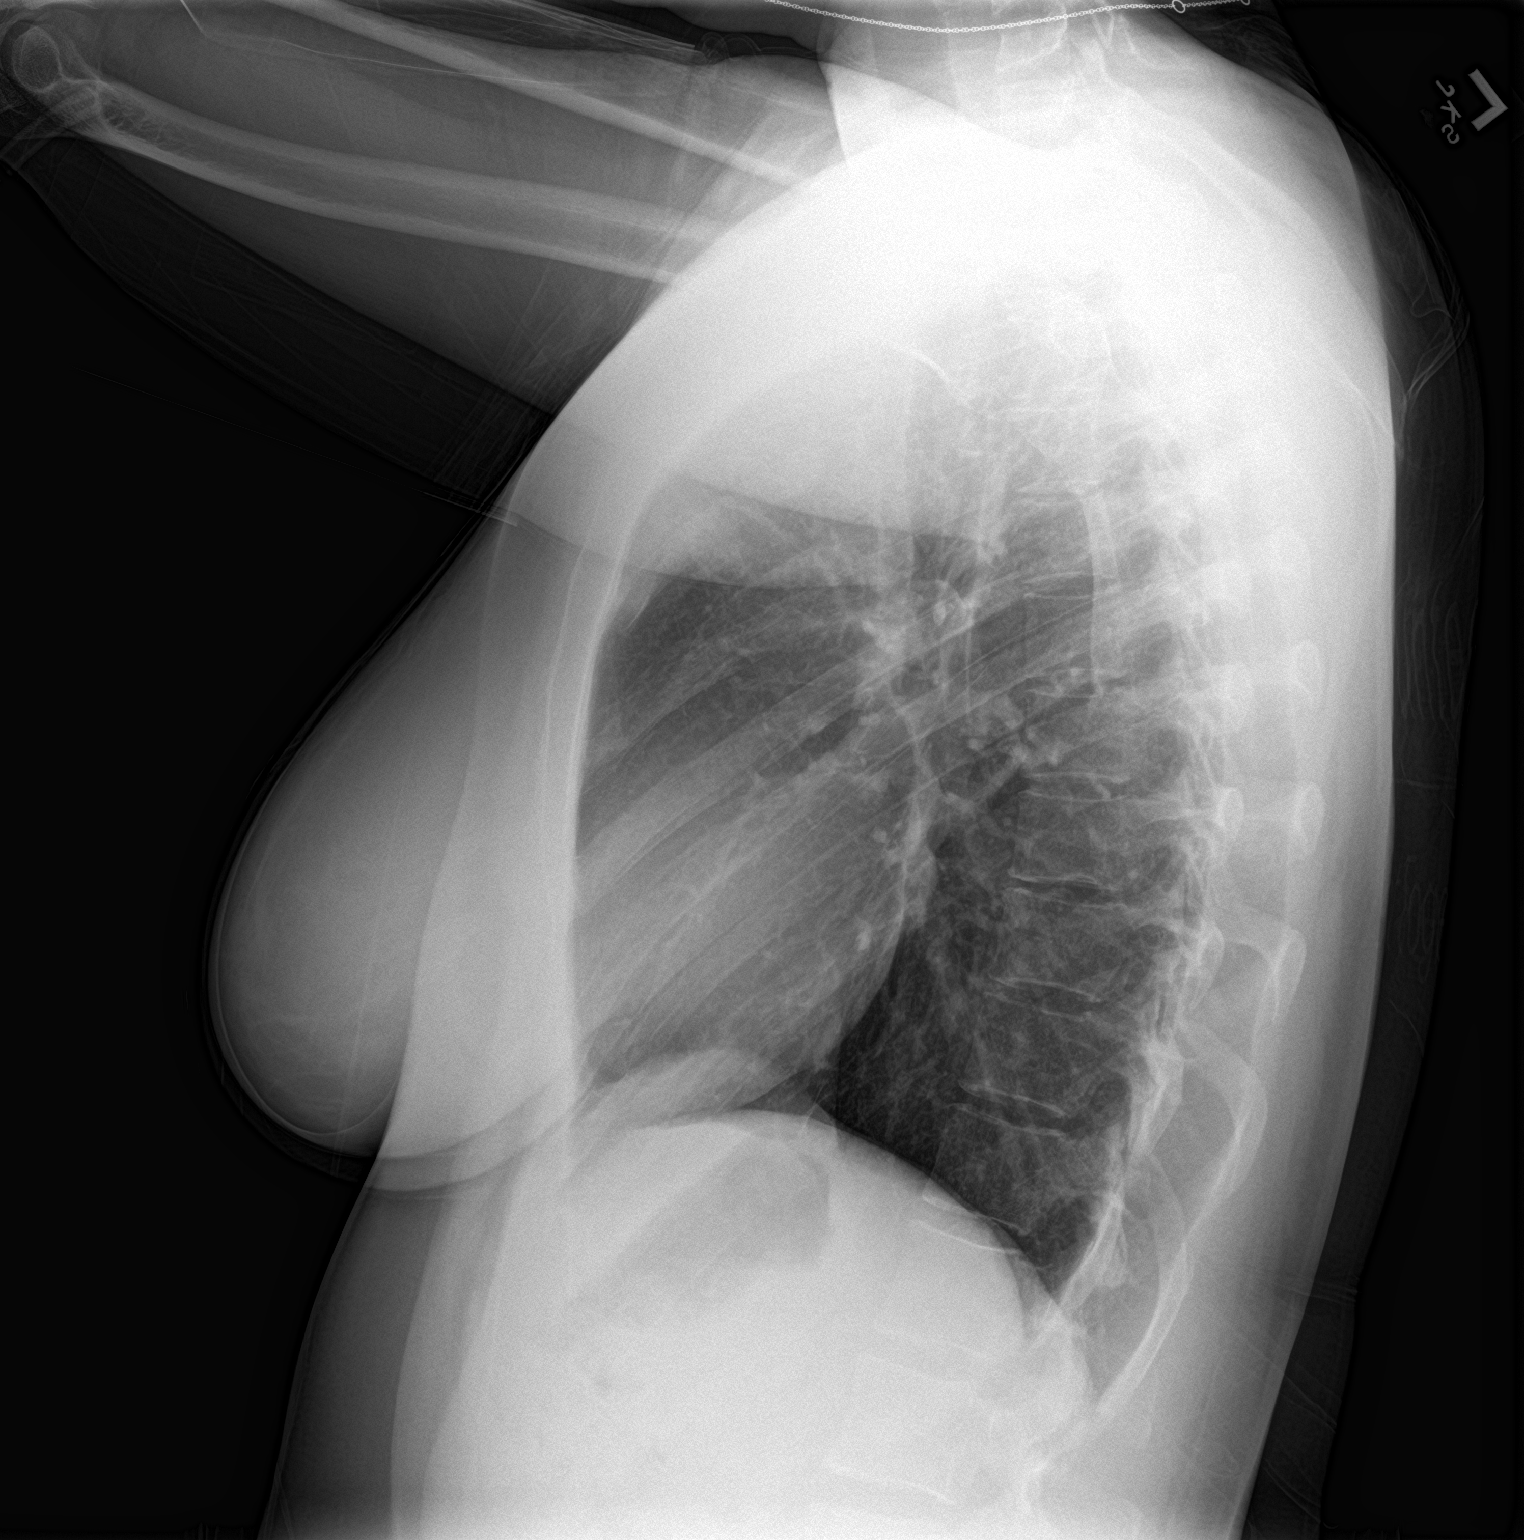

[2 of 2 positions shown; findings below may reference images not displayed]

FINDINGS: Normal heart size and mediastinal contours. No acute infiltrate or
edema. No effusion or pneumothorax. No acute osseous findings.
IMPRESSION: Negative chest.

## 2021-10-29 MED ORDER — LIDOCAINE VISCOUS HCL 2 % MT SOLN
15.0000 mL | Freq: Once | OROMUCOSAL | Status: AC
  Administered 2021-10-29: 15 mL via ORAL
  Filled 2021-10-29: qty 15

## 2021-10-29 MED ORDER — ALUM & MAG HYDROXIDE-SIMETH 200-200-20 MG/5ML PO SUSP
30.0000 mL | Freq: Once | ORAL | Status: AC
  Administered 2021-10-29: 30 mL via ORAL
  Filled 2021-10-29: qty 30

## 2021-10-29 NOTE — ED Notes (Signed)
ED Provider at bedside. 

## 2021-10-29 NOTE — ED Provider Notes (Signed)
? ?[facility] ?Provider Note ? ? ? Event Date/Time  ? First MD Initiated Contact with Patient 10/29/21 0701   ?  (approximate) ? ? ?History  ? ?Chest Pain ? ? ?HPI ? ?Byrdie Miyazaki is a 31 y.o. female who presents with complaints of chest pain.  Patient describes a burning sensation in her chest usually accompanied by discomfort in her left arm and in her neck as well.  Patient reports this is been going on for over a year.  She frequently has neuropathy as well.  Has seen cardiology, wore an event monitor, had normal echocardiogram.  Has seen neurology as well negative MRI.  Episodes occur approximately once a month.  She also describes some pleurisy and generalized fatigue ?  ? ? ?Physical Exam  ? ?Triage Vital Signs: ?ED Triage Vitals  ?Enc Vitals Group  ?   BP 10/29/21 0654 118/80  ?   Pulse Rate 10/29/21 0654 93  ?   Resp 10/29/21 0654 20  ?   Temp 10/29/21 0657 98 ?F (36.7 ?C)  ?   Temp Source 10/29/21 0657 Oral  ?   SpO2 10/29/21 0654 100 %  ?   Weight 10/29/21 0657 75.3 kg (166 lb)  ?   Height 10/29/21 0657 1.651 m (5\' 5" )  ?   Head Circumference --   ?   Peak Flow --   ?   Pain Score 10/29/21 0647 7  ?   Pain Loc --   ?   Pain Edu? --   ?   Excl. in GC? --   ? ? ?Most recent vital signs: ?Vitals:  ? 10/29/21 0730 10/29/21 0945  ?BP: 113/73 (!) 96/54  ?Pulse: 80 65  ?Resp:  18  ?Temp:    ?SpO2: 100% 98%  ? ? ? ?General: Awake, no distress.  ?CV:  Good peripheral perfusion.  Regular rate and rhythm ?Resp:  Normal effort.  CTA B ?Abd:  No distention.  ?Other:  Neuro exam is normal ? ? ?ED Results / Procedures / Treatments  ? ?Labs ?(all labs ordered are listed, but only abnormal results are displayed) ?Labs Reviewed  ?BASIC METABOLIC PANEL - Abnormal; Notable for the following components:  ?    Result Value  ? Creatinine, Ser 1.04 (*)   ? Calcium 8.3 (*)   ? All other components within normal limits  ?CBC  ?D-DIMER, QUANTITATIVE  ?MAGNESIUM  ?VITAMIN B12  ?POC URINE PREG, ED   ?TROPONIN I (HIGH SENSITIVITY)  ? ? ? ?EKG ? ?ED ECG REPORT ?I, 10/31/21, the attending physician, personally viewed and interpreted this ECG. ? ?Date: 10/29/2021 ? ?Rhythm: normal sinus rhythm ?QRS Axis: normal ?Intervals: normal ?ST/T Wave abnormalities: normal ?Narrative Interpretation: no evidence of acute ischemia ? ? ? ?RADIOLOGY ?Chest x-ray viewed and interpreted by me, no acute abnormality ? ? ? ?PROCEDURES: ? ?Critical Care performed:  ? ?Procedures ? ? ?MEDICATIONS ORDERED IN ED: ?Medications  ?alum & mag hydroxide-simeth (MAALOX/MYLANTA) 200-200-20 MG/5ML suspension 30 mL (30 mLs Oral Given 10/29/21 0925)  ?  And  ?lidocaine (XYLOCAINE) 2 % viscous mouth solution 15 mL (15 mLs Oral Given 10/29/21 0925)  ? ? ? ?IMPRESSION / MDM / ASSESSMENT AND PLAN / ED COURSE  ?I reviewed the triage vital signs and the nursing notes. ? ? ?Patient presents with constellation of symptoms as detailed above, primary complaint is chest pain today.  Overall she is well-appearing, frustrated/anxious with continued symptoms over a year.  She is  low risk for ACS, EKG is reassuring. ? ?She does describe a pleuritic component and generalized fatigue question possible PE although the still low likelihood.  We will send D-dimer ? ?We will add magnesium, B12 given symptoms of neuropathy, pending troponin, CBC CMP ? ?Lab work reviewed and is reassuring, normal troponin, normal D-dimer, normal CBC CMP ? ?We will trial GI cocktail question possible esophagitis/GERD as a cause of her chest discomfort. ? ?Have requested that patient follow-up with cardiology, return precautions discussed, she agrees with this plan ? ? ?  ? ? ?FINAL CLINICAL IMPRESSION(S) / ED DIAGNOSES  ? ?Final diagnoses:  ?Atypical chest pain  ? ? ? ?Rx / DC Orders  ? ?ED Discharge Orders   ? ? None  ? ?  ? ? ? ?Note:  This document was prepared using Dragon voice recognition software and may include unintentional dictation errors. ?  ?Jene Every, MD ?10/29/21  1105 ? ?

## 2021-10-29 NOTE — ED Triage Notes (Signed)
Reports chest "burning", chest pain that started at 2AM. Pt reports frequent episodes of this, thinks it is related to anxiety. Took 2 klonopin without any decrease in symptoms. Pt tearful on assessment, states she feels anxious and is "tired of this happenening". Has PCP appt today. ?

## 2021-10-30 ENCOUNTER — Other Ambulatory Visit: Payer: Self-pay | Admitting: Ophthalmology

## 2021-10-30 DIAGNOSIS — H5712 Ocular pain, left eye: Secondary | ICD-10-CM

## 2021-11-09 ENCOUNTER — Ambulatory Visit: Payer: BC Managed Care – PPO

## 2021-12-04 ENCOUNTER — Other Ambulatory Visit: Payer: Self-pay | Admitting: Family Medicine

## 2021-12-04 DIAGNOSIS — M5416 Radiculopathy, lumbar region: Secondary | ICD-10-CM

## 2021-12-04 DIAGNOSIS — M542 Cervicalgia: Secondary | ICD-10-CM

## 2021-12-19 ENCOUNTER — Ambulatory Visit
Admission: RE | Admit: 2021-12-19 | Discharge: 2021-12-19 | Disposition: A | Discharge status: Home / Self Care | Payer: BC Managed Care – PPO | Source: Ambulatory Visit | Attending: Family Medicine | Admitting: Family Medicine

## 2021-12-19 DIAGNOSIS — M542 Cervicalgia: Secondary | ICD-10-CM | POA: Diagnosis present

## 2021-12-19 DIAGNOSIS — M5416 Radiculopathy, lumbar region: Secondary | ICD-10-CM

## 2021-12-19 IMAGING — MR MR CERVICAL SPINE W/O CM
5 series · 39 of 48 positions shown · non-contrast
Comparison: CT of the cervical spine [DATE].

CLINICAL DATA: Provided history: Cervicalgia. Additional history
provided by scanning technologist: Patient reports left-sided face,
arm and leg numbness/tingling for 1 year.

EXAM:
MRI CERVICAL SPINE WITHOUT CONTRAST
TECHNIQUE: Multiplanar, multisequence MR imaging of the cervical spine was
performed. No intravenous contrast was administered.

[Series 5: T2 · sagittal · 3.0mm · 0.55mm/px · 6 of 15 slices shown (1 of 2)]
[im 1/15]
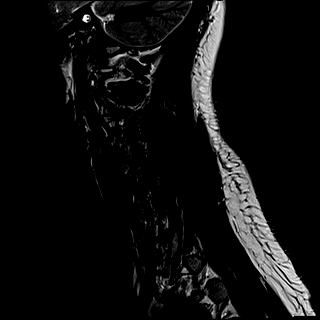
[im 3/15]
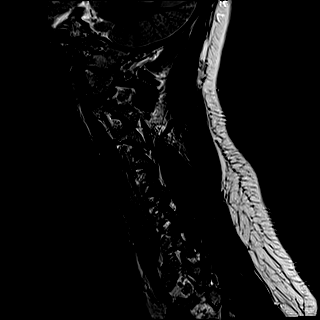
[im 6/15]
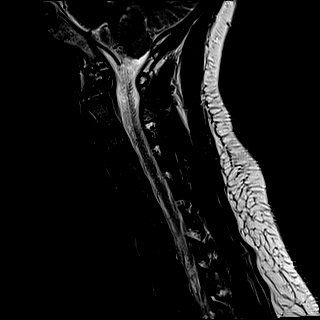
[im 9/15]
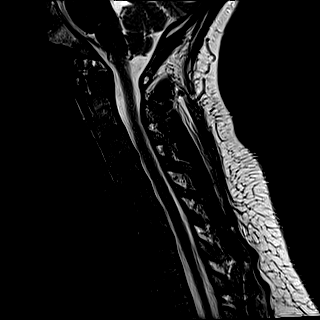
[im 12/15]
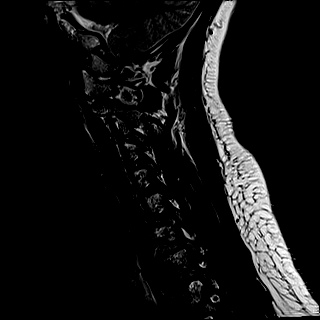
[im 15/15]
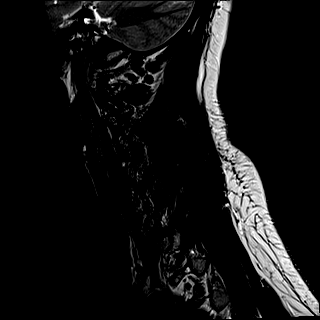

[Series 6: FLAIR · sagittal · 3.0mm · 0.78mm/px · 7 of 15 slices shown]
[im 1/15]
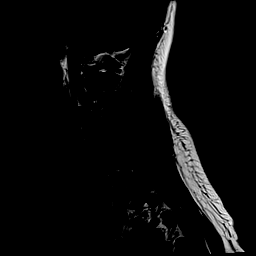
[im 3/15]
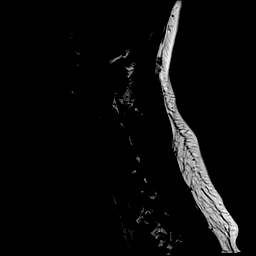
[im 5/15]
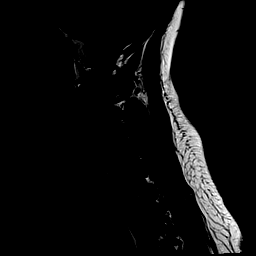
[im 8/15]
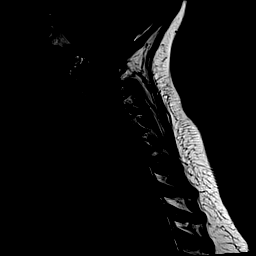
[im 10/15]
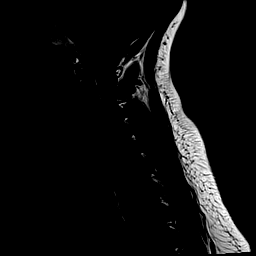
[im 12/15]
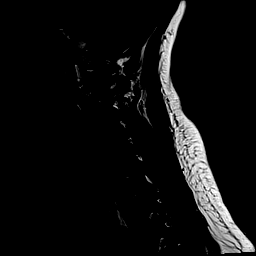
[im 15/15]
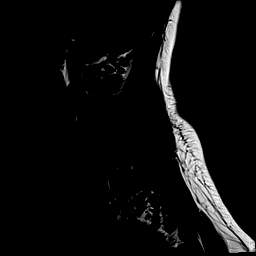

[Series 7: STIR · sagittal · 3.0mm · 0.62mm/px · 7 of 15 slices shown]
[im 1/15]
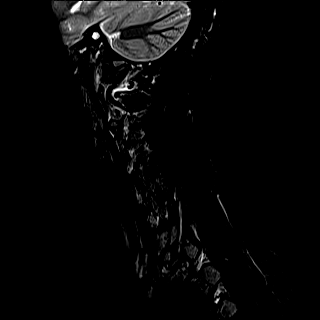
[im 3/15]
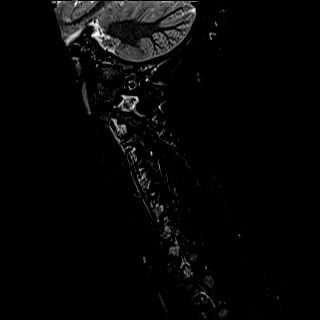
[im 5/15]
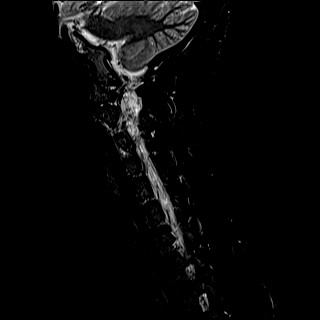
[im 8/15]
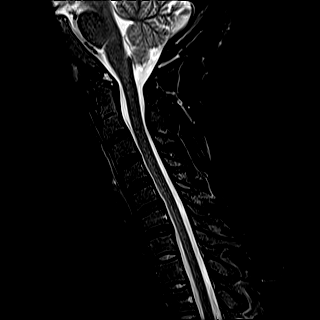
[im 10/15]
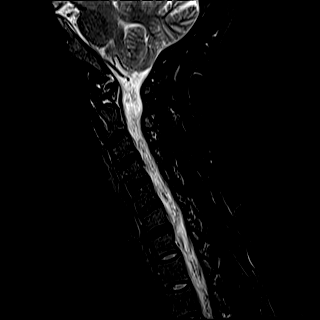
[im 12/15]
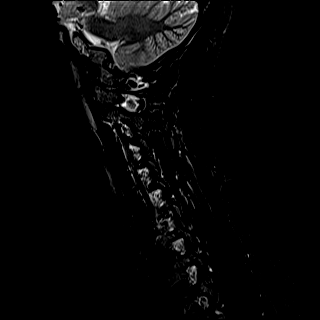
[im 15/15]
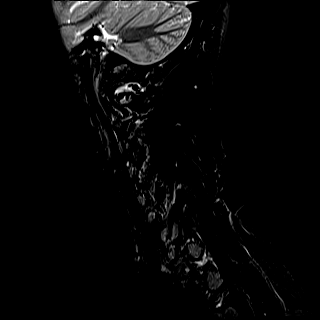

[Series 8: T2 · axial · 3.0mm · 0.70mm/px · z∈[-84,+11]mm · 11 of 29 slices shown (2 of 2)]
[im 1/29]
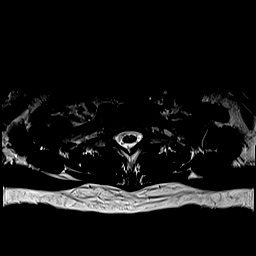
[im 3/29]
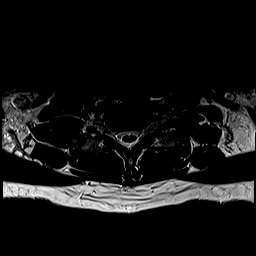
[im 5/29]
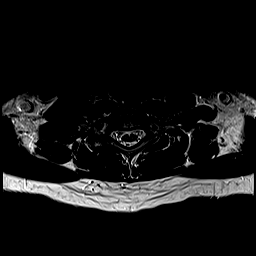
[im 7/29]
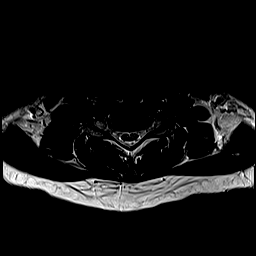
[im 9/29]
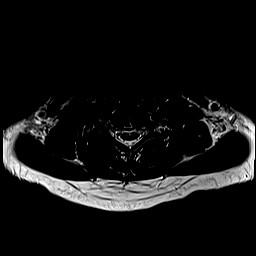
[im 11/29]
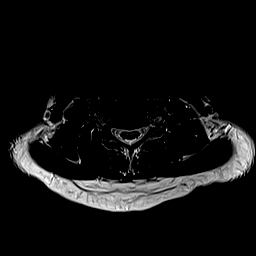
[im 13/29]
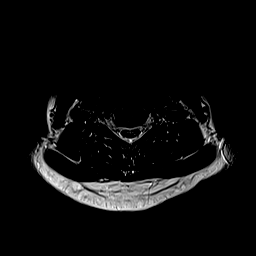
[im 16/29]
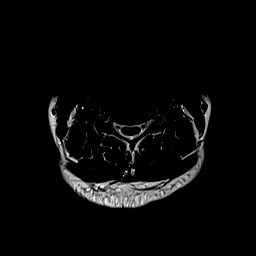
[im 20/29]
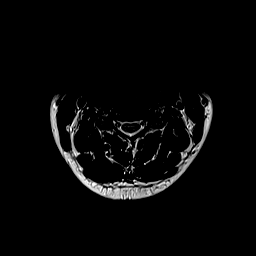
[im 24/29]
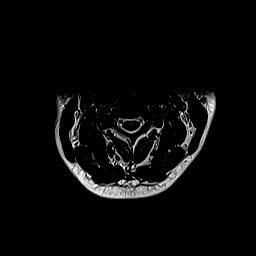
[im 29/29]
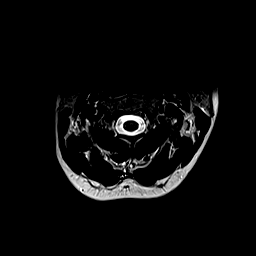

[Series 9: ax mpgr · axial · 3.0mm · 0.35mm/px · z∈[-84,+11]mm · 8 of 29 slices shown]
[im 1/29]
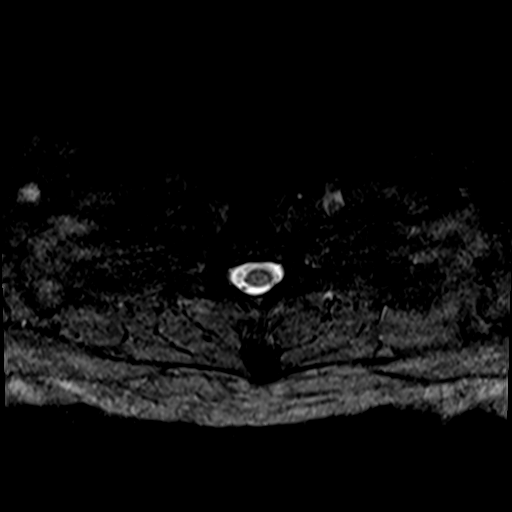
[im 5/29]
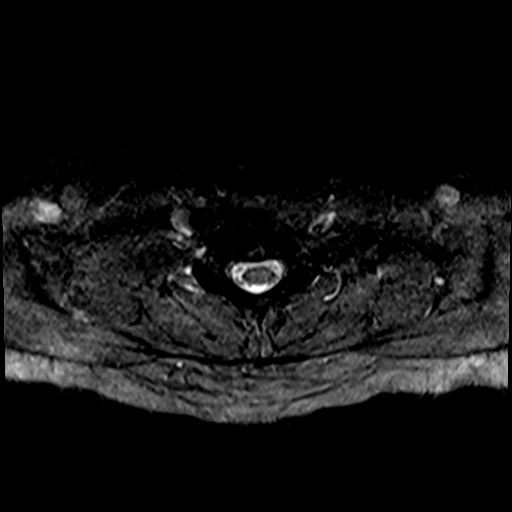
[im 9/29]
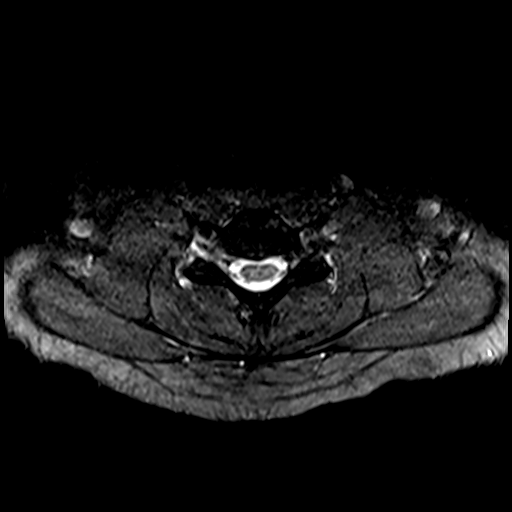
[im 13/29]
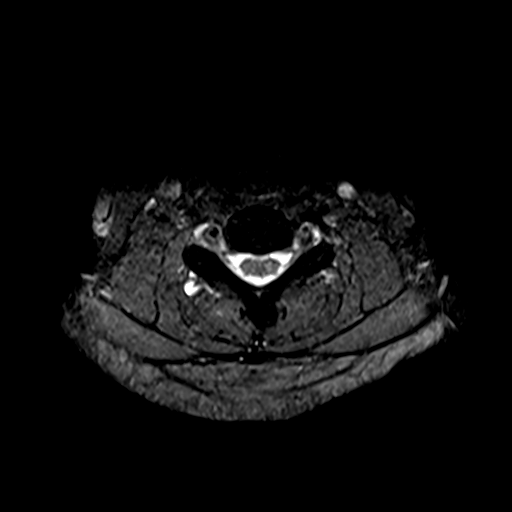
[im 16/29]
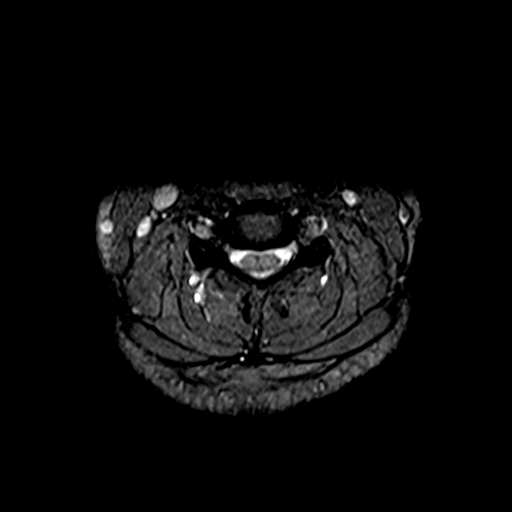
[im 20/29]
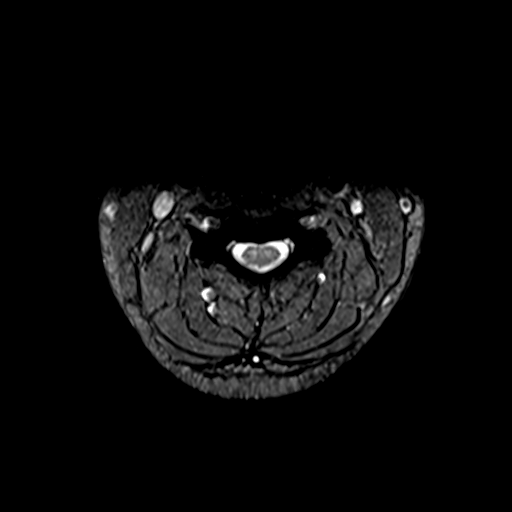
[im 24/29]
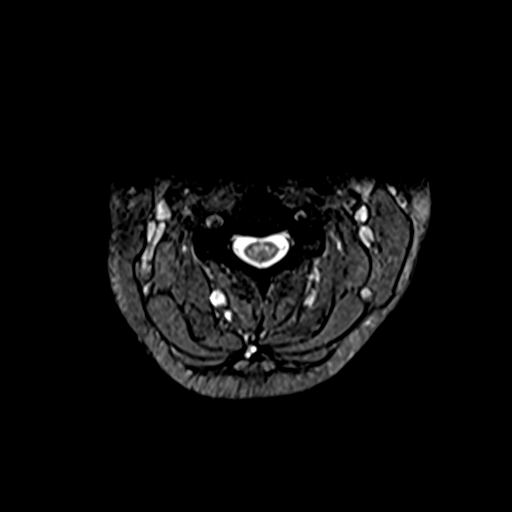
[im 29/29]
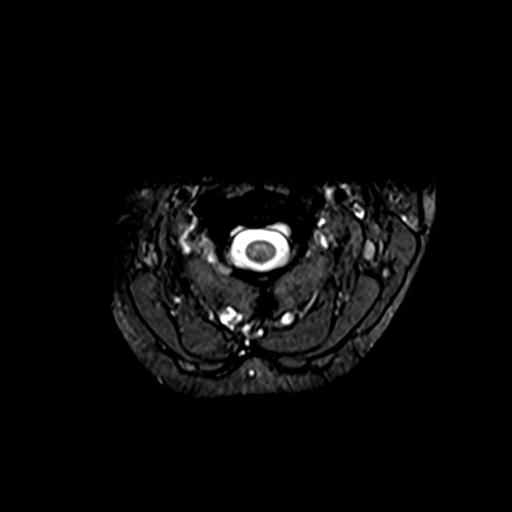

[39 of 48 positions shown; findings below may reference images not displayed]

FINDINGS: Alignment: Mild reversal of the expected cervical lordosis. Trace
grade 1 anterolisthesis at C4-C5 and C5-C6.

Vertebrae: Vertebral body height is maintained. No significant
marrow edema or focal suspicious osseous lesion.

Cord: No signal abnormality identified within the cervical spinal
cord.

Posterior Fossa, vertebral arteries, paraspinal tissues: No
abnormality identified within included portions of the posterior
fossa. Flow voids preserved within the imaged cervical vertebral
arteries. Paraspinal soft tissues unremarkable.

Disc levels:

Mild multilevel disc degeneration.

C2-C3: No significant disc herniation or stenosis.

C3-C4: Slight disc bulge. No significant spinal canal or foraminal
stenosis.

C4-C5: Trace grade 1 anterolisthesis. No significant disc herniation
or stenosis.

C5-C6: Trace grade 1 anterolisthesis. Slight disc bulge. No
significant spinal canal or foraminal stenosis.

C6-C7: Slight disc bulge. No significant spinal canal or foraminal
stenosis.

C7-T1: No significant disc herniation or stenosis.
IMPRESSION: Mild cervical spondylosis, as outlined. No significant spinal canal
or foraminal stenosis.

Mild nonspecific reversal of the expected cervical lordosis.

Trace grade 1 anterolisthesis at C4-C5 and C5-C6.

## 2021-12-19 IMAGING — MR MR LUMBAR SPINE W/O CM
5 series · 31 of 48 positions shown · non-contrast
Comparison: No pertinent prior exams available for comparison.

CLINICAL DATA: Provided history: Lumbar radiculopathy. Additional
history provided by scanning technologist: Patient reports
left-sided face, arm and leg numbness/tingling for 1 year.

EXAM:
MRI LUMBAR SPINE WITHOUT CONTRAST
TECHNIQUE: Multiplanar, multisequence MR imaging of the lumbar spine was
performed. No intravenous contrast was administered.

[Series 5: T2 · sagittal · 4.0mm · 0.81mm/px · 7 of 17 slices shown (1 of 2)]
[im 1/17]
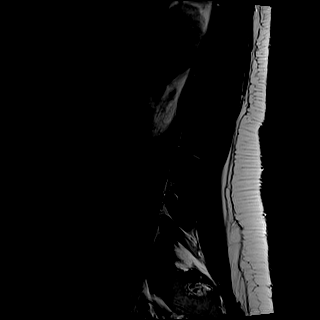
[im 3/17]
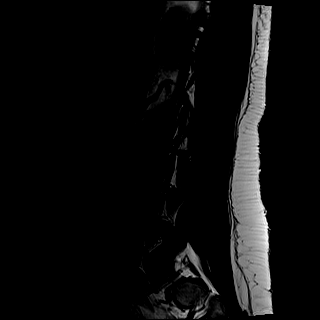
[im 6/17]
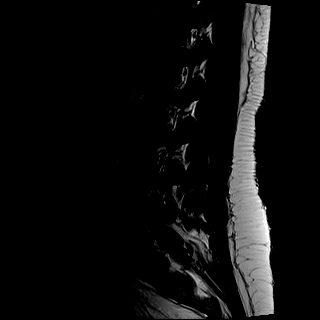
[im 9/17]
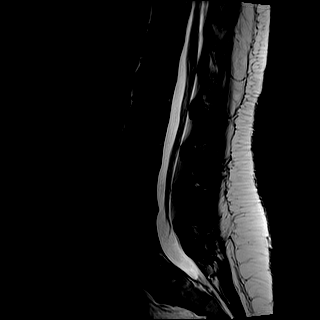
[im 11/17]
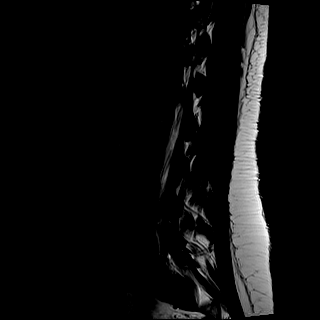
[im 14/17]
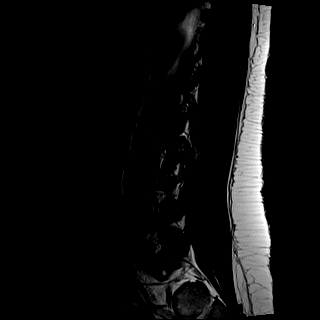
[im 17/17]
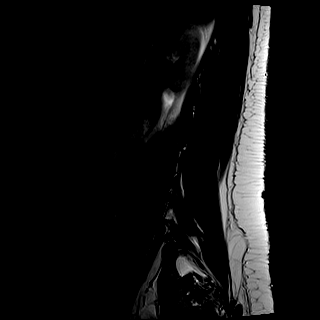

[Series 6: T1 · sagittal · 4.0mm · 0.81mm/px · 7 of 17 slices shown (1 of 2)]
[im 1/17]
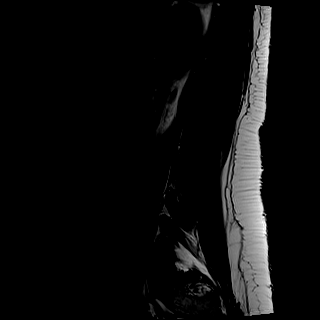
[im 3/17]
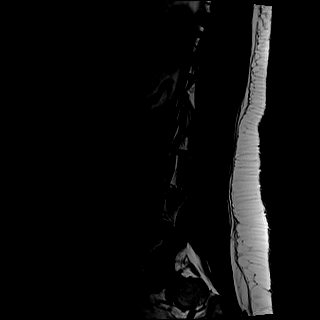
[im 6/17]
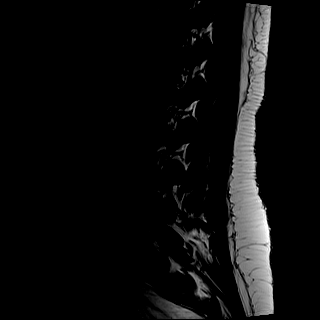
[im 9/17]
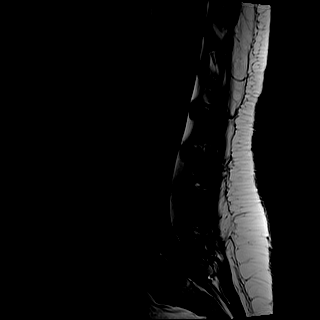
[im 11/17]
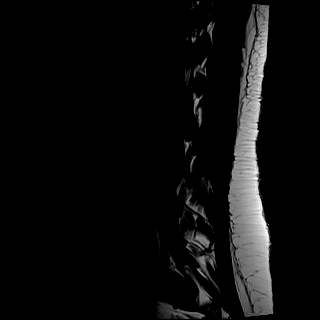
[im 14/17]
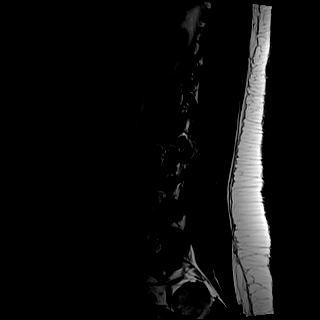
[im 17/17]
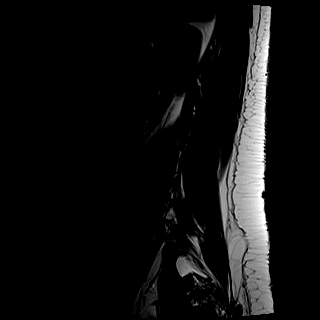

[Series 7: STIR · sagittal · 4.0mm · 0.41mm/px · 1 of 17 slices shown]
[im 1/17]
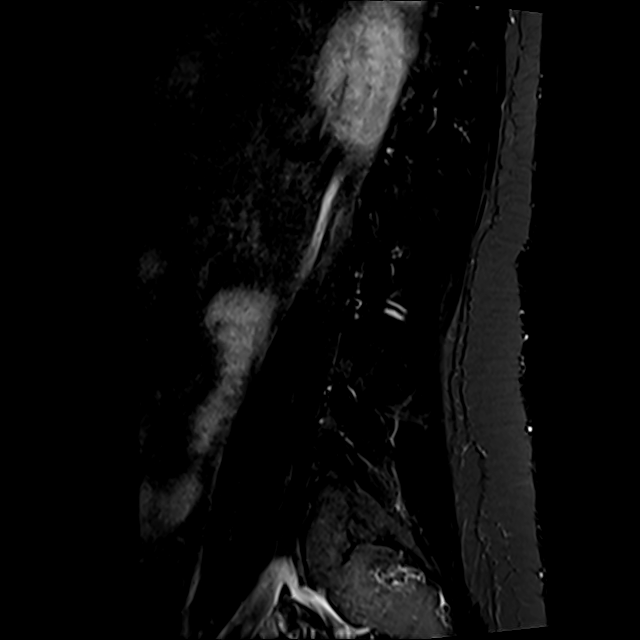

[Series 8: T2 · axial · 4.0mm · 0.78mm/px · z∈[-89,+131]mm · 8 of 38 slices shown (2 of 2)]
[im 1/38]
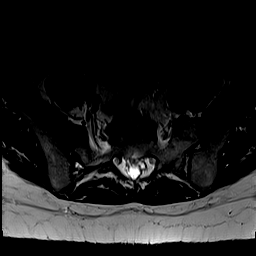
[im 6/38]
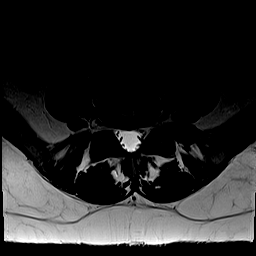
[im 12/38]
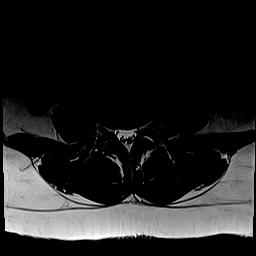
[im 18/38]
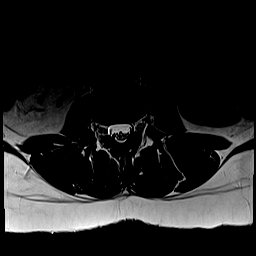
[im 20/38]
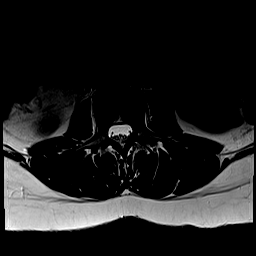
[im 26/38]
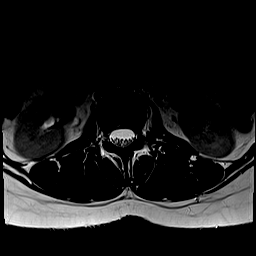
[im 32/38]
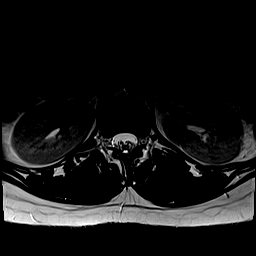
[im 38/38]
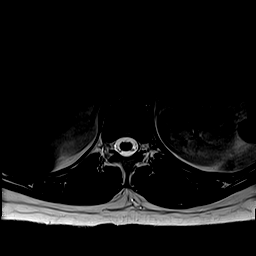

[Series 9: T1 · axial · 4.0mm · 0.39mm/px · z∈[-89,+131]mm · 8 of 38 slices shown (2 of 2)]
[im 1/38]
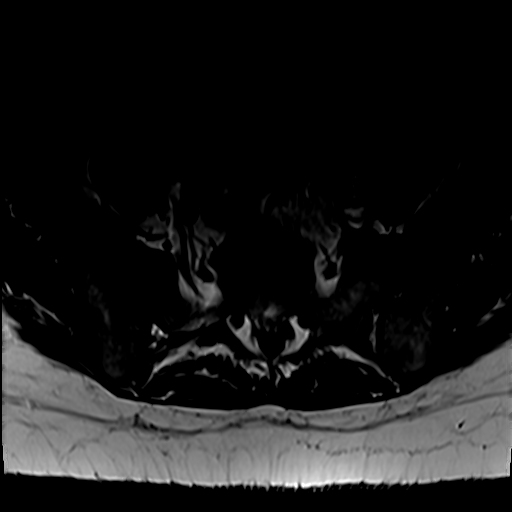
[im 6/38]
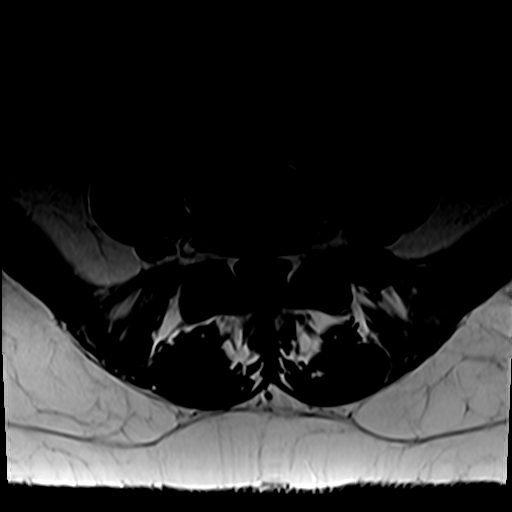
[im 12/38]
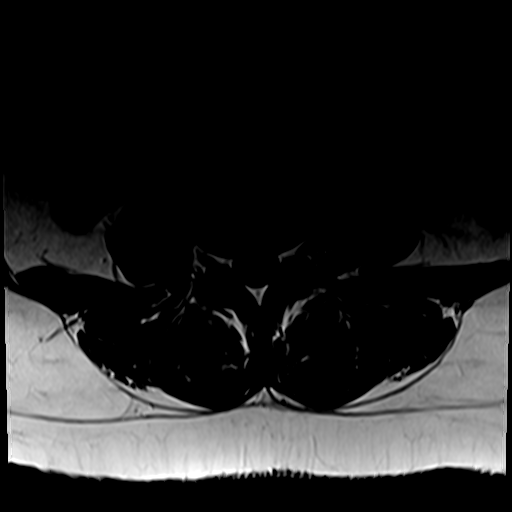
[im 18/38]
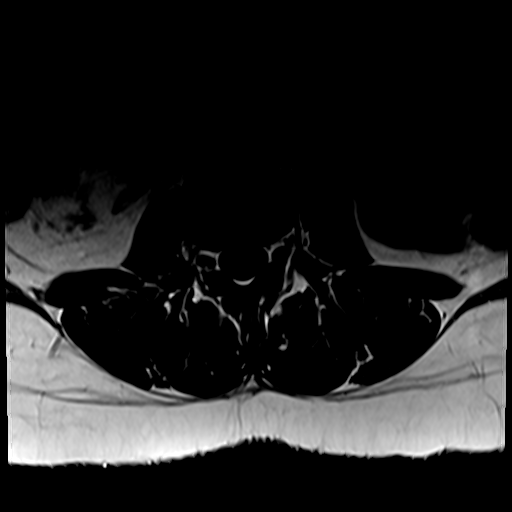
[im 20/38]
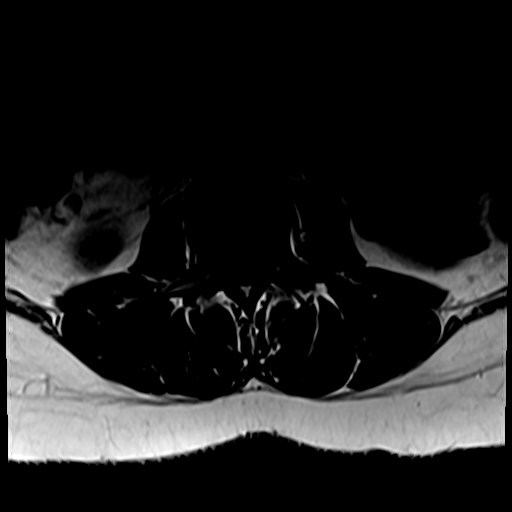
[im 26/38]
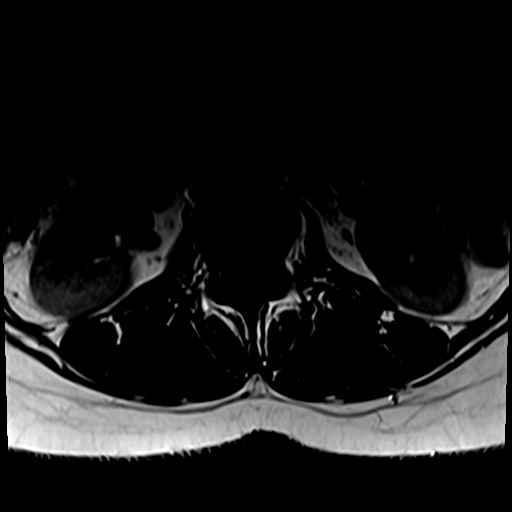
[im 32/38]
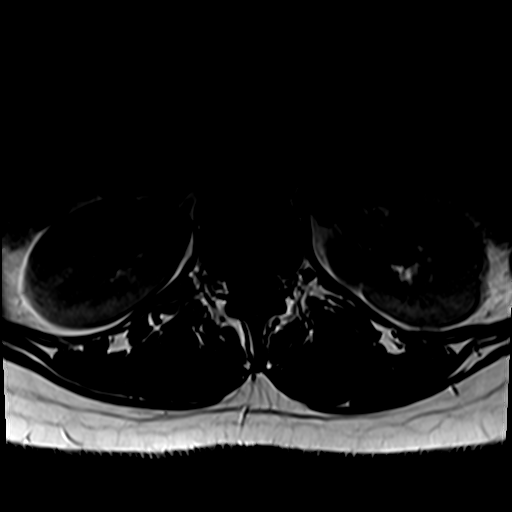
[im 38/38]
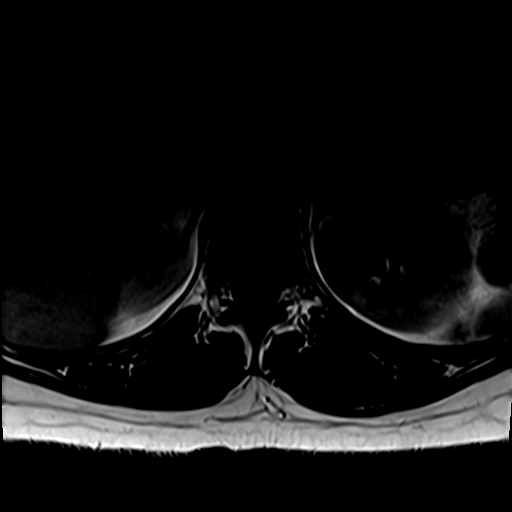

[31 of 48 positions shown; findings below may reference images not displayed]

FINDINGS: Segmentation: The lowest well-formed intervertebral disc space is
designated L5-S1. Small intervertebral disc and bilateral
assimilation joints present at S1-S2.

Alignment: Trace grade 1 retrolisthesis at L3-L4, L4-L5 and L5-S1.

Vertebrae: Vertebral body height is maintained. No significant
marrow edema or focal suspicious osseous lesion.

Conus medullaris and cauda equina: Conus extends to the L2 level. No
signal abnormality within the visualized distal spinal cord.

Paraspinal and other soft tissues: Tiny T2 hyperintense foci within
the left kidney, too small to characterize but likely reflecting
cysts. No paraspinal mass or collection.

Disc levels:

Mild disc degeneration at L4-L5.

T12-L1: No significant disc herniation or stenosis.

L1-L2: No significant disc herniation or stenosis.

L2-L3: No significant disc herniation or stenosis.

L3-L4: Trace grade 1 retrolisthesis. Slight disc bulge. Mild facet
arthrosis. Mild left subarticular narrowing (without appreciable
nerve root impingement). No significant central canal stenosis or
neural foraminal narrowing.

L4-L5: Trace grade 1 retrolisthesis. Broad-based central/left
subarticular disc protrusion. Mild facet arthrosis. The disc
protrusion results in moderate left subarticular stenosis,
contacting and crowding the descending left L5 nerve root (series 8,
image 28). The disc protrusion also results in mild relative right
subarticular and central canal narrowing. Minimal relative bilateral
neural foraminal narrowing.

L5-S1: Trace grade 1 retrolisthesis. Mild facet arthrosis. No
significant disc herniation or spinal canal stenosis. Mild relative
right neural foraminal narrowing. Minimal relative left neural
foraminal narrowing.
IMPRESSION: Lumbar spondylosis, as outlined and with findings most notably as
follows.

At L4-L5, there is trace grade 1 retrolisthesis. Mild disc
degeneration. Broad-based central/left subarticular disc protrusion.
Mild facet arthrosis. The disc protrusion results in moderate left
subarticular stenosis, contacting and crowding the descending left
L5 nerve root. The disc protrusion also results in mild relative
right subarticular and central canal narrowing. Minimal relative
bilateral neural foraminal narrowing.

At L3-L4, there is trace grade 1 retrolisthesis. Multifactorial mild
left subarticular narrowing (without appreciable nerve root
impingement).

At L5-S1, there is trace grade 1 retrolisthesis. Mild facet
arthrosis. Resultant mild relative right neural foraminal narrowing,
and minimal relative left neural foraminal narrowing.

## 2023-08-20 NOTE — L&D Delivery Note (Signed)
 Obstetrical Delivery Note   Date of Delivery:   04/08/2024 Primary OB:   AOB Gestational Age/EDD: [redacted]w[redacted]d (Dated by LMP) Reason for Admission: Elective IOL  Antepartum complications: none  Delivered By:   LITTIE Jackson,   Delivery Type:   spontaneous vaginal delivery  Delivery Details:   Received Cytotec  x2 then a cook's catheter. Cook's came out around 1400. She was started on pitocin . She received a labor epidural.Labor progressed linearly from there. SROM for clear at 1845. She was found to be complete at 1848. She began pushing at 1930, variables noted with pushing. While on her Left side she had a SVB of a viable female at 64. Infant birthed OA to ROA, cord noted around neck, infant somersaulted through with easy shoulders. Infant placed on mother's abdomen, spontaneous cry, HR>100. Cord doubly clamped and cut by dad once pulsation ceased. Placenta delivered with minimal maternal effort-apparently intact accessory lobe not present. On inspection of the perineum a shallow first degree was noted, a few stitches placed with 3-0 vicryl for hemostasis. Infant skin to skin with mom. Both stable.  Anesthesia:    epidural Intrapartum complications: None GBS:    Negative Laceration:    1st degree Episiotomy:    none Rectal exam:   deferred Placenta:    Delivered and expressed via active management. Intact: yes.No accessory lobe noted  To pathology: no.  Delayed Cord Clamping: yes Estimated Blood Loss:   Baby:    Liveborn female, APGARs 8/9, weight pending gm   Aimee Jackson, [place]  [facility] 04/08/2024 8:26 PM

## 2023-08-28 ENCOUNTER — Ambulatory Visit: Payer: No Typology Code available for payment source | Admitting: Obstetrics & Gynecology

## 2023-08-28 ENCOUNTER — Encounter: Payer: Self-pay | Admitting: Obstetrics & Gynecology

## 2023-08-28 VITALS — BP 117/81 | HR 99 | Ht 65.0 in | Wt 178.2 lb

## 2023-08-28 DIAGNOSIS — O219 Vomiting of pregnancy, unspecified: Secondary | ICD-10-CM

## 2023-08-28 DIAGNOSIS — N912 Amenorrhea, unspecified: Secondary | ICD-10-CM

## 2023-08-28 DIAGNOSIS — Z3200 Encounter for pregnancy test, result unknown: Secondary | ICD-10-CM

## 2023-08-28 DIAGNOSIS — Z3A01 Less than 8 weeks gestation of pregnancy: Secondary | ICD-10-CM

## 2023-08-28 DIAGNOSIS — Z3401 Encounter for supervision of normal first pregnancy, first trimester: Secondary | ICD-10-CM

## 2023-08-28 DIAGNOSIS — Z7689 Persons encountering health services in other specified circumstances: Secondary | ICD-10-CM | POA: Diagnosis not present

## 2023-08-28 LAB — POCT URINE PREGNANCY: Preg Test, Ur: POSITIVE — AB

## 2023-08-28 MED ORDER — ONDANSETRON HCL 4 MG PO TABS: 4.0000 mg | ORAL_TABLET | Freq: Three times a day (TID) | ORAL | 0 refills | Status: DC | PRN

## 2023-08-28 NOTE — Progress Notes (Signed)
    GYNECOLOGY PROGRESS NOTE  Subjective:    Patient ID: Aimee Jackson, female    DOB: [birth year], 33 y.o.   MRN: [id number]  HPI  Patient is a 33 y.o. married P63 ( 34 yo son) here as a new patient for pregnancy confirmation. Her only issue today is that of nausea and vomitting. She has gained about 6 pounds in spite of this. She gained about 50 pounds with her last pregnancy. She denies any pregnancy/delivery complications with her son.  She used family planning for contraception for about 2 years and then got pregnant the first time she opted to allow pregnancy.  The following portions of the patient's history were reviewed and updated as appropriate: allergies, current medications, past family history, past medical history, past social history, past surgical history, and problem list.  Review of Systems Pertinent items are noted in HPI.   Objective:   Blood pressure 117/81, pulse 99, height 5' 5 (1.651 m), weight 178 lb 3.2 oz (80.8 kg), last menstrual period 07/08/2023. Body mass index is 29.65 kg/m. Well nourished, well hydrated White female, no apparent distress She is ambulating and conversing normally.   Assessment:   1. Establishing care with new doctor, encounter for   2. Possible pregnancy, not yet confirmed   3. Encounter for supervision of normal first pregnancy in first trimester      Plan:   1. Establishing care with new doctor, encounter for (Primary) - she will schedule a NOB visit in about 4 weeks.  2. Possible pregnancy, not yet confirmed  - POCT urine pregnancy  3. Encounter for supervision of normal first pregnancy in first trimester  - US  OB Comp Less 14 Wks; Future  4. Nausea and vomitting with pregnancy- rec accupressure bands, vitamin B6 25 mg 3-4 times per day with doxylamine 25 mg at bedtime prn  Zofran  prescribed if OTC methods don't work.  I rec'd a total of no more than 20 pounds in pregnancy due to risks of stillbirth.

## 2023-09-01 ENCOUNTER — Encounter: Payer: No Typology Code available for payment source | Admitting: Certified Nurse Midwife

## 2023-09-10 ENCOUNTER — Telehealth: Payer: No Typology Code available for payment source

## 2023-09-12 ENCOUNTER — Telehealth (INDEPENDENT_AMBULATORY_CARE_PROVIDER_SITE_OTHER): Payer: No Typology Code available for payment source

## 2023-09-12 VITALS — Ht 65.0 in | Wt 178.0 lb

## 2023-09-12 DIAGNOSIS — Z3481 Encounter for supervision of other normal pregnancy, first trimester: Secondary | ICD-10-CM

## 2023-09-12 DIAGNOSIS — Z3400 Encounter for supervision of normal first pregnancy, unspecified trimester: Secondary | ICD-10-CM

## 2023-09-12 DIAGNOSIS — Z3A09 9 weeks gestation of pregnancy: Secondary | ICD-10-CM | POA: Diagnosis not present

## 2023-09-12 DIAGNOSIS — Z3490 Encounter for supervision of normal pregnancy, unspecified, unspecified trimester: Secondary | ICD-10-CM | POA: Insufficient documentation

## 2023-09-12 NOTE — Progress Notes (Signed)
New OB Intake  I connected with  Aimee Jackson on 09/12/23 at  1:15 PM EST by MyChart Video Visit and verified that I am speaking with the correct person using two identifiers. Nurse is located at [facility] and pt is located at home.  I discussed the limitations, risks, security and privacy concerns of performing an evaluation and management service by telephone and the availability of in person appointments. I also discussed with the patient that there may be a patient responsible charge related to this service. The patient expressed understanding and agreed to proceed.  I explained I am completing New OB Intake today. We discussed her EDD of 04/13/24 that is based on LMP of 07/08/23. Pt is G3/P1011. I reviewed her allergies, medications, Medical/Surgical/OB history, and appropriate screenings. There are cats in the home in the home, No. Based on history, this is a/an pregnancy uncomplicated . Her obstetrical history is significant for  none .  Patient Active Problem List   Diagnosis Date Noted   Encounter for supervision of low-risk pregnancy 09/12/2023    Concerns addressed today  Delivery Plans:  Plans to deliver at [facility]  Dating [place] Explained first scheduled [place] will be around 9-10 weeks. Dating [place] scheduled for 09/16/23 at 1:00. Pt notified to arrive at 12:45.  Labs Discussed genetic screening with patient. Patient wants genetic testing to be drawn at new OB visit. Discussed possible labs to be drawn at new OB appointment.  COVID Vaccine Patient has not had COVID vaccine.   Social Determinants of Health Food Insecurity: denies food insecurity WIC Referral: Patient is not interested in referral to [facility].  Transportation: Patient denies transportation needs. Childcare: Discussed no children allowed at ultrasound appointments.   First visit review I reviewed new OB appt with pt. I explained she will have ob bloodwork and pap smear/pelvic exam if indicated.  Explained pt will be seen by Hartley Barefoot, CNM at first visit; encounter routed to appropriate provider.   Loman Chroman, CMA 09/12/2023  1:42 PM

## 2023-09-12 NOTE — Patient Instructions (Addendum)
Prenatal Classes offered through [facility] [email or web address]   Hospital Tour [email or web address] Tours are on the 3rd Sunday of the month at 3 pm. Register online, there are only 5 openings a month so register early to guarantee your spot.     First Trimester of Pregnancy  The first trimester of pregnancy starts on the first day of your last monthly period until the end of week 13. This is months 1 through 3 of pregnancy. A week after a sperm fertilizes an egg, the egg will implant into the wall of the uterus and begin to develop into a baby. Body changes during your first trimester Your body goes through many changes during pregnancy. The changes usually return to normal after your baby is born. Physical changes Your breasts may grow larger and may hurt. The area around your nipples may get darker. Your periods will stop. Your hair and nails may grow faster. You may pee more often. Health changes You may tire easily. Your gums may bleed and may be sensitive when you brush and floss. You may not feel hungry. You may have heartburn. You may throw up or feel like you may throw up. You may want to eat some foods, but not others. You may have headaches. You may have trouble pooping (constipation). Other changes Your emotions may change from day to day. You may have more dreams. Follow these instructions at home: Medicines Talk to your health care provider if you're taking medicines. Ask if the medicines are safe to take during pregnancy. Your provider may change the medicines that you take. Do not take any medicines unless told to by your provider. Take a prenatal vitamin that has at least 600 micrograms (mcg) of folic acid. Do not use herbal medicines, illegal substances, or medicines that  are not approved by your provider. Eating and drinking While you're pregnant your body needs extra food for your growing baby. Talk with your provider about what to eat while pregnant. Activity Most women are able to exercise during pregnancy. Exercises may need to change as your pregnancy goes on. Talk to your provider about your activities and exercise routines. Relieving pain and discomfort Wear a good, supportive bra if your breasts hurt. Rest with your legs raised if you have leg cramps or low back pain. Safety Wear your seatbelt at all times when you're in a car. Talk to your provider if someone hits you, hurts you, or yells at you. Talk with your provider if you're feeling sad or have thoughts of hurting yourself. Lifestyle Certain things can be harmful while you're pregnant. Follow these rules: Do not use hot tubs, steam rooms, or saunas. Do not douche. Do not use tampons or scented pads. Do not drink alcohol,smoke, vape, or use products with nicotine or tobacco in them. If you need help quitting, talk with your provider. Avoid cat litter boxes and soil used by cats. These things carry germs that can cause harm to your pregnancy and your baby. General instructions Keep all follow-up visits. It helps you and your unborn baby stay as healthy as possible. Write down your questions. Take them to your visits. Your provider will: Talk with you about your overall health. Give you advice or refer you to specialists who can help with different needs, including: Prenatal education classes. Mental health and counseling. Foods and healthy eating. Ask for help if you need help with food. Call your dentist and ask to be seen. Brush your teeth with a soft toothbrush. Floss gently. Where  to find more information American Pregnancy Association: americanpregnancy.org [employer] of Obstetricians and Gynecologists: acog.org Office on [employer] Health: TravelLesson.ca Contact a health care  provider if: You feel dizzy, faint, or have a fever. You vomit or have watery poop (diarrhea) for 2 days or more. You have abnormal discharge or bleeding from your vagina. You have pain when you pee or your pee smells bad. You have cramps, pain, or pressure in your belly area. Get help right away if: You have trouble breathing or chest pain. You have any kind of injury, such as from a fall or a car crash. These symptoms may be an emergency. Get help right away. Call 911. Do not wait to see if the symptoms will go away. Do not drive yourself to the hospital. This information is not intended to replace advice given to you by your health care provider. Make sure you discuss any questions you have with your health care provider. Document Revised: 05/08/2023 Document Reviewed: 12/06/2022 Elsevier Patient Education  2024 Elsevier Inc.Commonly Asked Questions During Pregnancy  Cats: A parasite can be excreted in cat feces.  To avoid exposure you need to have another person empty the little box.  If you must empty the litter box you will need to wear gloves.  Wash your hands after handling your cat.  This parasite can also be found in raw or undercooked meat so this should also be avoided.  Colds, Sore Throats, Flu: Please check your medication sheet to see what you can take for symptoms.  If your symptoms are unrelieved by these medications please call the office.  Dental Work: Most any dental work Agricultural consultant recommends is permitted.  X-rays should only be taken during the first trimester if absolutely necessary.  Your abdomen should be shielded with a lead apron during all x-rays.  Please notify your provider prior to receiving any x-rays.  Novocaine is fine; gas is not recommended.  If your dentist requires a note from [place] prior to dental work please call the office and we will provide one for you.  Exercise: Exercise is an important part of staying healthy during your pregnancy.  You may continue  most exercises you were accustomed to prior to pregnancy.  Later in your pregnancy you will most likely notice you have difficulty with activities requiring balance like riding a bicycle.  It is important that you listen to your body and avoid activities that put you at a higher risk of falling.  Adequate rest and staying well hydrated are a must!  If you have questions about the safety of specific activities ask your provider.    Exposure to Children with illness: Try to avoid obvious exposure; report any symptoms to [place] when noted,  If you have chicken pos, red measles or mumps, you should be immune to these diseases.   Please do not take any vaccines while pregnant unless you have checked with your OB provider.  Fetal Movement: After 28 weeks we recommend you do "kick counts" twice daily.  Lie or sit down in a calm quiet environment and count your baby movements "kicks".  You should feel your baby at least 10 times per hour.  If you have not felt 10 kicks within the first hour get up, walk around and have something sweet to eat or drink then repeat for an additional hour.  If count remains less than 10 per hour notify your provider.  Fumigating: Follow your pest control agent's advice as to how long  to stay out of your home.  Ventilate the area well before re-entering.  Hemorrhoids:   Most over-the-counter preparations can be used during pregnancy.  Check your medication to see what is safe to use.  It is important to use a stool softener or fiber in your diet and to drink lots of liquids.  If hemorrhoids seem to be getting worse please call the office.   Hot Tubs:  Hot tubs Jacuzzis and saunas are not recommended while pregnant.  These increase your internal body temperature and should be avoided.  Intercourse:  Sexual intercourse is safe during pregnancy as long as you are comfortable, unless otherwise advised by your provider.  Spotting may occur after intercourse; report any bright red bleeding  that is heavier than spotting.  Labor:  If you know that you are in labor, please go to the hospital.  If you are unsure, please call the office and let us help you decide what to do.  Lifting, straining, etc:  If your job requires heavy lifting or straining please check with your provider for any limitations.  Generally, you should not lift items heavier than that you can lift simply with your hands and arms (no back muscles)  Painting:  Paint fumes do not harm your pregnancy, but may make you ill and should be avoided if possible.  Latex or water based paints have less odor than oils.  Use adequate ventilation while painting.  Permanents & Hair Color:  Chemicals in hair dyes are not recommended as they cause increase hair dryness which can increase hair loss during pregnancy.  " Highlighting" and permanents are allowed.  Dye may be absorbed differently and permanents may not hold as well during pregnancy.  Sunbathing:  Use a sunscreen, as skin burns easily during pregnancy.  Drink plenty of fluids; avoid over heating.  Tanning Beds:  Because their possible side effects are still unknown, tanning beds are not recommended.  Ultrasound Scans:  Routine ultrasounds are performed at approximately 20 weeks.  You will be able to see your baby's general anatomy an if you would like to know the gender this can usually be determined as well.  If it is questionable when you conceived you may also receive an ultrasound early in your pregnancy for dating purposes.  Otherwise ultrasound exams are not routinely performed unless there is a medical necessity.  Although you can request a scan we ask that you pay for it when conducted because insurance does not cover " patient request" scans.  Work: If your pregnancy proceeds without complications you may work until your due date, unless your physician or employer advises otherwise.  Round Ligament Pain/Pelvic Discomfort:  Sharp, shooting pains not associated with  bleeding are fairly common, usually occurring in the second trimester of pregnancy.  They tend to be worse when standing up or when you remain standing for long periods of time.  These are the result of pressure of certain pelvic ligaments called "round ligaments".  Rest, Tylenol and heat seem to be the most effective relief.  As the womb and fetus grow, they rise out of the pelvis and the discomfort improves.  Please notify the office if your pain seems different than that described.  It may represent a more serious condition.  Common Medications Safe in Pregnancy  Acne:      Constipation:  Benzoyl Peroxide     Colace  Clindamycin      Dulcolax Suppository  Topica Erythromycin     Fibercon  Salicylic Acid      Metamucil         Miralax AVOID:        Senakot   Accutane    Cough:  Retin-A       Cough Drops  Tetracycline      Phenergan w/ Codeine if Rx  Minocycline      Robitussin (Plain & DM)  Antibiotics:     Crabs/Lice:  Ceclor       RID  Cephalosporins    AVOID:  E-Mycins      Kwell  Keflex  Macrobid/Macrodantin   Diarrhea:  Penicillin      Kao-Pectate  Zithromax      Imodium AD         PUSH FLUIDS AVOID:       Cipro     Fever:  Tetracycline      Tylenol (Regular or Extra  Minocycline       Strength)  Levaquin      Extra Strength-Do not          Exceed 8 tabs/24 hrs Caffeine:        200mg /day (equiv. To 1 cup of coffee or  approx. 3 12 oz sodas)         Gas: Cold/Hayfever:       Gas-X  Benadryl      Mylicon  Claritin       Phazyme  **Claritin-D        Chlor-Trimeton    Headaches:  Dimetapp      ASA-Free Excedrin  Drixoral-Non-Drowsy     Cold Compress  Mucinex (Guaifenasin)     Tylenol (Regular or Extra  Sudafed/Sudafed-12 Hour     Strength)  **Sudafed PE Pseudoephedrine   Tylenol Cold & Sinus     Vicks Vapor Rub  Zyrtec  **AVOID if Problems With Blood Pressure         Heartburn: Avoid lying down for at least 1 hour after  meals  Aciphex      Maalox     Rash:  Milk of Magnesia     Benadryl    Mylanta       1% Hydrocortisone Cream  Pepcid  Pepcid Complete   Sleep Aids:  Prevacid      Ambien   Prilosec       Benadryl  Rolaids       Chamomile Tea  Tums (Limit 4/day)     Unisom         Tylenol PM         Warm milk-add vanilla or  Hemorrhoids:       Sugar for taste  Anusol/Anusol H.C.  (RX: Analapram 2.5%)  Sugar Substitutes:  Hydrocortisone OTC     Ok in moderation  Preparation H      Tucks        Vaseline lotion applied to tissue with wiping    Herpes:     Throat:  Acyclovir      Oragel  Famvir  Valtrex     Vaccines:         Flu Shot Leg Cramps:       *Gardasil  Benadryl      Hepatitis A         Hepatitis B Nasal Spray:       Pneumovax  Saline Nasal Spray     Polio Booster         Tetanus Nausea:       Tuberculosis test or PPD  Vitamin  B6 25 mg TID   AVOID:    Dramamine      *Gardasil  Emetrol       Live Poliovirus  Ginger Root 250 mg QID    MMR (measles, mumps &  High Complex Carbs @ Bedtime    rebella)  Sea Bands-Accupressure    Varicella (Chickenpox)  Unisom 1/2 tab TID     *No known complications           If received before Pain:         Known pregnancy;   Darvocet       Resume series after  Lortab        Delivery  Percocet    Yeast:   Tramadol      Femstat  Tylenol 3      Gyne-lotrimin  Ultram       Monistat  Vicodin           MISC:         All Sunscreens           Hair Coloring/highlights          Insect Repellant's          (Including DEET)         Mystic Tans

## 2023-09-16 ENCOUNTER — Ambulatory Visit: Payer: No Typology Code available for payment source

## 2023-09-16 DIAGNOSIS — Z3A09 9 weeks gestation of pregnancy: Secondary | ICD-10-CM | POA: Diagnosis not present

## 2023-09-16 DIAGNOSIS — Z3401 Encounter for supervision of normal first pregnancy, first trimester: Secondary | ICD-10-CM | POA: Diagnosis not present

## 2023-10-06 ENCOUNTER — Encounter: Payer: Self-pay | Admitting: Certified Nurse Midwife

## 2023-10-06 ENCOUNTER — Other Ambulatory Visit (HOSPITAL_COMMUNITY)
Admission: RE | Admit: 2023-10-06 | Discharge: 2023-10-06 | Disposition: A | Discharge status: Home / Self Care | Payer: No Typology Code available for payment source | Source: Ambulatory Visit | Attending: Certified Nurse Midwife | Admitting: Certified Nurse Midwife

## 2023-10-06 ENCOUNTER — Ambulatory Visit (INDEPENDENT_AMBULATORY_CARE_PROVIDER_SITE_OTHER): Payer: No Typology Code available for payment source | Admitting: Certified Nurse Midwife

## 2023-10-06 VITALS — BP 112/59 | HR 70 | Wt 180.6 lb

## 2023-10-06 DIAGNOSIS — Z3480 Encounter for supervision of other normal pregnancy, unspecified trimester: Secondary | ICD-10-CM | POA: Diagnosis present

## 2023-10-06 DIAGNOSIS — Z3481 Encounter for supervision of other normal pregnancy, first trimester: Secondary | ICD-10-CM | POA: Diagnosis not present

## 2023-10-06 DIAGNOSIS — Z3A12 12 weeks gestation of pregnancy: Secondary | ICD-10-CM | POA: Diagnosis not present

## 2023-10-06 DIAGNOSIS — Z363 Encounter for antenatal screening for malformations: Secondary | ICD-10-CM

## 2023-10-06 DIAGNOSIS — Z113 Encounter for screening for infections with a predominantly sexual mode of transmission: Secondary | ICD-10-CM

## 2023-10-06 DIAGNOSIS — Z114 Encounter for screening for human immunodeficiency virus [HIV]: Secondary | ICD-10-CM

## 2023-10-06 DIAGNOSIS — Z369 Encounter for antenatal screening, unspecified: Secondary | ICD-10-CM

## 2023-10-06 DIAGNOSIS — Z1379 Encounter for other screening for genetic and chromosomal anomalies: Secondary | ICD-10-CM

## 2023-10-06 DIAGNOSIS — Z1329 Encounter for screening for other suspected endocrine disorder: Secondary | ICD-10-CM

## 2023-10-06 DIAGNOSIS — Z3491 Encounter for supervision of normal pregnancy, unspecified, first trimester: Secondary | ICD-10-CM

## 2023-10-06 DIAGNOSIS — Z131 Encounter for screening for diabetes mellitus: Secondary | ICD-10-CM

## 2023-10-06 NOTE — Progress Notes (Signed)
 NEW OB HISTORY AND PHYSICAL  SUBJECTIVE:       Aimee Jackson is a 33 y.o. G56P1011 female, Patient's last menstrual period was 07/08/2023 (exact date)., Estimated Date of Delivery: 04/13/24, [redacted]w[redacted]d, presents today for establishment of Prenatal Care. She reports nausea daily, overall lower appetite but starting to feel better & infrequently taking zofran. Denies vaginal bleeding, change to vaginal discharge. Reports occasional headaches, tylenol has helped, already using magnesium cream to help with sleep/anxiety. Interested in genetic screening, will check cost through insurance.  Social history Partner/Relationship: Chief of Staff, husband Living situation: husband & son-[place] Work: IT, works from home Exercise: yard work, walking Substance use: no T/E/D  Indications for ASA therapy (per [employer]) One of the following: Previous pregnancy with preeclampsia, especially early onset and with an adverse outcome No Multifetal gestation No Chronic hypertension No Type 1 or 2 diabetes mellitus No Chronic kidney disease No Autoimmune disease (antiphospholipid syndrome, systemic lupus erythematosus) No  Two or more of the following: Nulliparity No Obesity (body mass index >30 kg/m2) Yes Family history of preeclampsia in mother or sister Yes Age >=35 years No Sociodemographic characteristics (African American race, low socioeconomic level) No Personal risk factors (eg, previous pregnancy with low birth weight or small for gestational age infant, previous adverse pregnancy outcome [eg, stillbirth], interval >10 years between pregnancies) No   Gynecologic History Patient's last menstrual period was 07/08/2023 (exact date). Normal Contraception: none Last Pap: 2024. Results were: normal  Obstetric History OB History  Gravida Para Term Preterm AB Living  3 1 1  1 1   SAB IAB Ectopic Multiple Live Births  1    1    # Outcome Date GA Lbr Len/2nd Weight Sex Type Anes PTL Lv  3 Current            2 Term 02/10/19 [redacted]w[redacted]d  6 lb 1 oz (2.75 kg) M Vag-Spont EPI  LIV  1 SAB 2019            Past Medical History:  Diagnosis Date   Anxiety     Past Surgical History:  Procedure Laterality Date   TONSILECTOMY, ADENOIDECTOMY, BILATERAL MYRINGOTOMY AND TUBES      Current Outpatient Medications on File Prior to Visit  Medication Sig Dispense Refill   ondansetron (ZOFRAN) 4 MG tablet Take 1 tablet (4 mg total) by mouth every 8 (eight) hours as needed for nausea or vomiting. 20 tablet 0   Prenatal Vit-Fe Fumarate-FA (MULTIVITAMIN-PRENATAL) 27-0.8 MG TABS tablet Take 1 tablet by mouth daily at 12 noon.     sertraline (ZOLOFT) 25 MG tablet Take 25 mg by mouth daily.     No current facility-administered medications on file prior to visit.    Allergies  Allergen Reactions   Aleve [Naproxen] Anaphylaxis   Naproxen Sodium Anaphylaxis and Swelling   Codeine Nausea And Vomiting    Social History   Socioeconomic History   Marital status: Married    Spouse name: Not on file   Number of children: Not on file   Years of education: Not on file   Highest education level: Not on file  Occupational History   Not on file  Tobacco Use   Smoking status: Never   Smokeless tobacco: Never  Vaping Use   Vaping status: Never Used  Substance and Sexual Activity   Alcohol use: Not Currently    Comment: occasionally   Drug use: Never   Sexual activity: Yes    Birth control/protection: None  Other Topics Concern  Not on file  Social History Narrative   Not on file   Social Drivers of Health   Financial Resource Strain: Low Risk  (03/04/2023)   Received from [facility]   Overall Financial Resource Strain (CARDIA)    Difficulty of Paying Living Expenses: Not hard at all  Food Insecurity: No Food Insecurity (03/04/2023)   Received from [facility]   Hunger Vital Sign    Worried About Running Out of Food in the Last Year: Never true    Ran Out of Food in  the Last Year: Never true  Transportation Needs: No Transportation Needs (03/04/2023)   Received from [facility] - Transportation    In the past 12 months, has lack of transportation kept you from medical appointments or from getting medications?: No    Lack of Transportation (Non-Medical): No  Physical Activity: Insufficiently Active (02/09/2019)   Received from [facility], [facility]   Exercise Vital Sign    Days of Exercise per Week: 3 days    Minutes of Exercise per Session: 30 min  Stress: No Stress Concern Present (02/09/2019)   Received from [facility], [facility] of Occupational Health - Occupational Stress Questionnaire    Feeling of Stress : Only a little  Social Connections: Moderately Integrated (09/12/2023)   Social Connection and Isolation Panel [NHANES]    Frequency of Communication with Friends and Family: Three times a week    Frequency of Social Gatherings with Friends and Family: Once a week    Attends Religious Services: 1 to 4 times per year    Active Member of [employer] or Organizations: No    Attends Engineer, structural: Not on file    Marital Status: Married  Catering manager Violence: Not At Risk (09/12/2023)   Humiliation, Afraid, Rape, and Kick questionnaire    Fear of Current or Ex-Partner: No    Emotionally Abused: No    Physically Abused: No    Sexually Abused: No    Family History  Problem Relation Age of Onset   Healthy Mother     The following portions of the patient's history were reviewed and updated as appropriate: allergies, current medications, past OB history, past medical history, past surgical history, past family history, past social history, and problem list.  Constitutional: Denied constitutional symptoms, night sweats, recent illness, fever, insomnia and weight loss.  Eyes: Denied eye symptoms, eye pain, photophobia, vision change and visual disturbance.   Ears/Nose/Throat/Neck: Denied ear, nose, throat or neck symptoms, hearing loss, nasal discharge, sinus congestion and sore throat.  Cardiovascular: Denied cardiovascular symptoms, arrhythmia, chest pain/pressure, edema, exercise intolerance, orthopnea and palpitations.  Respiratory: Denied pulmonary symptoms, asthma, pleuritic pain, productive sputum, cough, dyspnea and wheezing.  Gastrointestinal: Denied gastro-esophageal reflux, melena  Genitourinary: Denied genitourinary symptoms including symptomatic vaginal discharge, pelvic relaxation issues, and urinary complaints.  Musculoskeletal: Denied musculoskeletal symptoms, stiffness, swelling, muscle weakness and myalgia.  Dermatologic: Denied dermatology symptoms, rash and scar.  Neurologic: Denied neurology symptoms, dizziness, neck pain and syncope.  Psychiatric: Denied psychiatric symptoms, anxiety and depression.  Endocrine: Denied endocrine symptoms including hot flashes and night sweats.     OBJECTIVE: Initial Physical Exam (New OB)  GENERAL APPEARANCE: alert, well appearing, in no apparent distress, oriented to person, place and time HEAD: normocephalic, atraumatic MOUTH: mucous membranes moist THYROID: no thyromegaly or masses present BREASTS: no masses noted, no significant tenderness, no palpable  axillary nodes, no skin changes LUNGS: clear to auscultation, no wheezes, rales or rhonchi, symmetric air entry HEART: regular rate and rhythm, no murmurs ABDOMEN: soft, nontender, nondistended, no abnormal masses, no epigastric pain NEUROLOGIC: alert, oriented, normal speech, no focal findings or movement disorder noted  PELVIC EXAM deferred FHR: 160  ASSESSMENT: Normal pregnancy   PLAN: Routine prenatal care. We discussed an overview of prenatal care and when to call. Reviewed diet, exercise, and weight gain recommendations in pregnancy. Discussed benefits of breastfeeding and lactation resources at [facility]. I reviewed labs and  answered all questions.  1. Supervision of other normal pregnancy, antepartum (Primary) - Culture, OB Urine - Monitor Drug Profile 14(MW) - Nicotine screen, urine - Urinalysis, Routine w reflex microscopic - Cervicovaginal ancillary only - NOB Panel; Future - Comprehensive metabolic panel - Hemoglobin A1c - Protein / creatinine ratio, urine - TSH + free T4 - NOB Panel - US OB Comp + 14 Wk; Future  2. Antenatal screening encounter  3. Genetic screening  4. Screening for human immunodeficiency virus  5. Screen for sexually transmitted diseases  6. Screening for diabetes mellitus - Hemoglobin A1c  7. Screening for thyroid disorder - TSH + free T4  8. Encounter for routine screening for malformation using ultrasonics - US OB Comp + 14 Wk; Future  Consider daily baby ASA for family hx of pre-eclampsia, BMI today >30, information sent via MyChart. Will contact provider if Maternit21 desired after finding out cost.  Dominica Severin, CNM

## 2023-10-06 NOTE — Patient Instructions (Addendum)
 First Trimester of Pregnancy  The first trimester of pregnancy starts on the first day of your last monthly period until the end of week 13. This is months 1 through 3 of pregnancy. A week after a sperm fertilizes an egg, the egg will implant into the wall of the uterus and begin to develop into a baby. Body changes during your first trimester Your body goes through many changes during pregnancy. The changes usually return to normal after your baby is born. Physical changes Your breasts may grow larger and may hurt. The area around your nipples may get darker. Your periods will stop. Your hair and nails may grow faster. You may pee more often. Health changes You may tire easily. Your gums may bleed and may be sensitive when you brush and floss. You may not feel hungry. You may have heartburn. You may throw up or feel like you may throw up. You may want to eat some foods, but not others. You may have headaches. You may have trouble pooping (constipation). Other changes Your emotions may change from day to day. You may have more dreams. Follow these instructions at home: Medicines Talk to your health care provider if you're taking medicines. Ask if the medicines are safe to take during pregnancy. Your provider may change the medicines that you take. Do not take any medicines unless told to by your provider. Take a prenatal vitamin that has at least 600 micrograms (mcg) of folic acid. Do not use herbal medicines, illegal substances, or medicines that are not approved by your provider. Eating and drinking While you're pregnant your body needs extra food for your growing baby. Talk with your provider about what to eat while pregnant. Activity Most women are able to exercise during pregnancy. Exercises may need to change as your pregnancy goes on. Talk to your provider about your activities and exercise routines. Relieving pain and discomfort Wear a good, supportive bra if your breasts  hurt. Rest with your legs raised if you have leg cramps or low back pain. Safety Wear your seatbelt at all times when you're in a car. Talk to your provider if someone hits you, hurts you, or yells at you. Talk with your provider if you're feeling sad or have thoughts of hurting yourself. Lifestyle Certain things can be harmful while you're pregnant. Follow these rules: Do not use hot tubs, steam rooms, or saunas. Do not douche. Do not use tampons or scented pads. Do not drink alcohol,smoke, vape, or use products with nicotine or tobacco in them. If you need help quitting, talk with your provider. Avoid cat litter boxes and soil used by cats. These things carry germs that can cause harm to your pregnancy and your baby. General instructions Keep all follow-up visits. It helps you and your unborn baby stay as healthy as possible. Write down your questions. Take them to your visits. Your provider will: Talk with you about your overall health. Give you advice or refer you to specialists who can help with different needs, including: Prenatal education classes. Mental health and counseling. Foods and healthy eating. Ask for help if you need help with food. Call your dentist and ask to be seen. Brush your teeth with a soft toothbrush. Floss gently. Where to find more information American Pregnancy Association: americanpregnancy.org [employer]: acog.org Office on [employer] Health: [employer] Contact a health care provider if: You feel dizzy, faint, or have a fever. You vomit or have watery poop (diarrhea) for 2  days or more. You have abnormal discharge or bleeding from your vagina. You have pain when you pee or your pee smells bad. You have cramps, pain, or pressure in your belly area. Get help right away if: You have trouble breathing or chest pain. You have any kind of injury, such as from a fall or a car crash. These symptoms may be an  emergency. Get help right away. Call 911. Do not wait to see if the symptoms will go away. Do not drive yourself to the hospital. This information is not intended to replace advice given to you by your health care provider. Make sure you discuss any questions you have with your health care provider. Document Revised: 05/08/2023 Document Reviewed: 12/06/2022 Elsevier Patient Education  2024 [employer] Second Trimester of Pregnancy  The second trimester of pregnancy is from week 14 through week 27. This is months 4 through 6 of pregnancy. During the second trimester: Morning sickness is less or has stopped. You may have more energy. You may feel hungry more often. At this time, your unborn baby is growing very fast. At the end of the sixth month, the unborn baby may be up to 12 inches long and weigh about 1 pounds. You will likely start to feel the baby move between 16 and 20 weeks of pregnancy. Body changes during your second trimester Your body continues to change during this time. The changes usually go away after your baby is born. Physical changes You will gain more weight. Your belly will get bigger. You may begin to get stretch marks on your hips, belly, and breasts. Your breasts will keep growing and may hurt. You may get dark spots or blotches on your face. A dark line from your belly button to the pubic area may appear. This line is called linea nigra. Your hair may grow faster and get thicker. Health changes You may have headaches. You may have heartburn. You may pee more often. You may have swollen, bulging veins (varicose veins). You may have trouble pooping (constipation), or swollen veins in the butt that can itch or get painful (hemorrhoids). You may have back pain. This is caused by: Weight gain. Pregnancy hormones that are relaxing the joints in your pelvis. Follow these instructions at home: Medicines Talk to your health care provider if you're taking  medicines. Ask if the medicines are safe to take during pregnancy. Your provider may change the medicines that you take. Do not take any medicines unless told to by your provider. Take a prenatal vitamin that has at least 600 micrograms (mcg) of folic acid. Do not use herbal medicines, illegal drugs, or medicines that are not approved by your provider. Eating and drinking While you're pregnant your body needs extra food for your growing baby. Talk with your provider about what to eat while pregnant. Activity Most women are able to exercise during pregnancy. Exercises may need to change as your pregnancy goes on. Talk to your provider about your activities and exercise routines. Relieving pain and discomfort Wear a good, supportive bra if your breasts hurt. Rest with your legs raised if you have leg cramps or low back pain. Take warm sitz baths to soothe pain from hemorrhoids. Use hemorrhoid cream if your provider says it's okay. Do not douche. Do not use tampons or scented pads. Do not use hot tubs, steam rooms, or saunas. Safety Wear your seatbelt at all times when you're in a car. Talk to your provider if someone hits you,  hurts you, or yells at you. Talk with your provider if you're feeling sad or have thoughts of hurting yourself. Lifestyle Certain things can be harmful while you're pregnant. It's best to avoid the following: Do not drink alcohol,smoke, vape, or use products with nicotine or tobacco in them. If you need help quitting, talk with your provider. Avoid cat litter boxes and soil used by cats. These things carry germs that can cause harm to your pregnancy and your baby. General instructions Keep all follow-up visits. It helps you and your unborn baby stay as healthy as possible. Write down your questions. Take them to your prenatal visits. Your provider will: Talk with you about your overall health. Give you advice or refer you to specialists who can help with different  needs, including: Prenatal education classes. Mental health and counseling. Foods and healthy eating. Ask for help if you need help with food. Where to find more information American Pregnancy Association: americanpregnancy.org [employer]: acog.org Office on [employer] Health: [employer] Contact a health care provider if: You have a headache that does not go away when you take medicine. You have any of these problems: You can't eat or drink. You throw up or feel like you may throw up. You have watery poop (diarrhea) for 2 days or more. You have pain when you pee or your pee smells bad. You have been sick for 2 days or more and are not getting better. Contact your provider right away if: You have any of these coming from your vagina: Abnormal discharge. Bad-smelling fluid. Bleeding. Your baby is moving less than usual. You have contractions, belly cramping, or have pain in your pelvis or lower back. You have symptoms of high blood pressure or preeclampsia. These include: A severe, throbbing headache that does not go away. Sudden or extreme swelling of your face, hands, legs, or feet. Vision problems: You see spots. You have blurry vision. Your eyes are sensitive to light. If you can't reach the provider, go to an urgent care or emergency room. Get help right away if: You faint, become confused, or can't think clearly. You have chest pain or trouble breathing. You have any kind of injury, such as from a fall or a car crash. These symptoms may be an emergency. Call 911 right away. Do not wait to see if the symptoms will go away. Do not drive yourself to the hospital. This information is not intended to replace advice given to you by your health care provider. Make sure you discuss any questions you have with your health care provider. Document Revised: 05/08/2023 Document Reviewed: 12/06/2022 Elsevier Patient Education  2024 Elsevier  Inc.  Commonly Asked Questions During Pregnancy  Cats: A parasite can be excreted in cat feces.  To avoid exposure you need to have another person empty the little box.  If you must empty the litter box you will need to wear gloves.  Wash your hands after handling your cat.  This parasite can also be found in raw or undercooked meat so this should also be avoided.  Colds, Sore Throats, Flu: Please check your medication sheet to see what you can take for symptoms.  If your symptoms are unrelieved by these medications please call the office.  Dental Work: Most any dental work Agricultural consultant recommends is permitted.  X-rays should only be taken during the first trimester if absolutely necessary.  Your abdomen should be shielded with a lead apron during all x-rays.  Please notify  your provider prior to receiving any x-rays.  Novocaine is fine; gas is not recommended.  If your dentist requires a note from [place] prior to dental work please call the office and we will provide one for you.  Exercise: Exercise is an important part of staying healthy during your pregnancy.  You may continue most exercises you were accustomed to prior to pregnancy.  Later in your pregnancy you will most likely notice you have difficulty with activities requiring balance like riding a bicycle.  It is important that you listen to your body and avoid activities that put you at a higher risk of falling.  Adequate rest and staying well hydrated are a must!  If you have questions about the safety of specific activities ask your provider.    Exposure to Children with illness: Try to avoid obvious exposure; report any symptoms to [place] when noted,  If you have chicken pos, red measles or mumps, you should be immune to these diseases.   Please do not take any vaccines while pregnant unless you have checked with your OB provider.  Fetal Movement: After 28 weeks we recommend you do "kick counts" twice daily.  Lie or sit down in a calm quiet  environment and count your baby movements "kicks".  You should feel your baby at least 10 times per hour.  If you have not felt 10 kicks within the first hour get up, walk around and have something sweet to eat or drink then repeat for an additional hour.  If count remains less than 10 per hour notify your provider.  Fumigating: Follow your pest control agent's advice as to how long to stay out of your home.  Ventilate the area well before re-entering.  Hemorrhoids:   Most over-the-counter preparations can be used during pregnancy.  Check your medication to see what is safe to use.  It is important to use a stool softener or fiber in your diet and to drink lots of liquids.  If hemorrhoids seem to be getting worse please call the office.   Hot Tubs:  Hot tubs Jacuzzis and saunas are not recommended while pregnant.  These increase your internal body temperature and should be avoided.  Intercourse:  Sexual intercourse is safe during pregnancy as long as you are comfortable, unless otherwise advised by your provider.  Spotting may occur after intercourse; report any bright red bleeding that is heavier than spotting.  Labor:  If you know that you are in labor, please go to the hospital.  If you are unsure, please call the office and let us help you decide what to do.  Lifting, straining, etc:  If your job requires heavy lifting or straining please check with your provider for any limitations.  Generally, you should not lift items heavier than that you can lift simply with your hands and arms (no back muscles)  Painting:  Paint fumes do not harm your pregnancy, but may make you ill and should be avoided if possible.  Latex or water based paints have less odor than oils.  Use adequate ventilation while painting.  Permanents & Hair Color:  Chemicals in hair dyes are not recommended as they cause increase hair dryness which can increase hair loss during pregnancy.  " Highlighting" and permanents are allowed.   Dye may be absorbed differently and permanents may not hold as well during pregnancy.  Sunbathing:  Use a sunscreen, as skin burns easily during pregnancy.  Drink plenty of fluids; avoid over heating.  Tanning Beds:  Because their possible side effects are still unknown, tanning beds are not recommended.  Ultrasound Scans:  Routine ultrasounds are performed at approximately 20 weeks.  You will be able to see your baby's general anatomy an if you would like to know the gender this can usually be determined as well.  If it is questionable when you conceived you may also receive an ultrasound early in your pregnancy for dating purposes.  Otherwise ultrasound exams are not routinely performed unless there is a medical necessity.  Although you can request a scan we ask that you pay for it when conducted because insurance does not cover " patient request" scans.  Work: If your pregnancy proceeds without complications you may work until your due date, unless your physician or employer advises otherwise.  Round Ligament Pain/Pelvic Discomfort:  Sharp, shooting pains not associated with bleeding are fairly common, usually occurring in the second trimester of pregnancy.  They tend to be worse when standing up or when you remain standing for long periods of time.  These are the result of pressure of certain pelvic ligaments called "round ligaments".  Rest, Tylenol and heat seem to be the most effective relief.  As the womb and fetus grow, they rise out of the pelvis and the discomfort improves.  Please notify the office if your pain seems different than that described.  It may represent a more serious condition.   Common Medications Safe in Pregnancy  Acne:      Constipation:  Benzoyl Peroxide     Colace  Clindamycin      Dulcolax Suppository  Topica Erythromycin     Fibercon  Salicylic Acid      Metamucil         Miralax AVOID:        Senakot   Accutane    Cough:  Retin-A       Cough  Drops  Tetracycline      Phenergan w/ Codeine if Rx  Minocycline      Robitussin (Plain & DM)  Antibiotics:     Crabs/Lice:  Ceclor       RID  Cephalosporins    AVOID:  E-Mycins      Kwell  Keflex  Macrobid/Macrodantin   Diarrhea:  Penicillin      Kao-Pectate  Zithromax      Imodium AD         PUSH FLUIDS AVOID:       Cipro     Fever:  Tetracycline      Tylenol (Regular or Extra  Minocycline       Strength)  Levaquin      Extra Strength-Do not          Exceed 8 tabs/24 hrs Caffeine:        200mg /day (equiv. To 1 cup of coffee or  approx. 3 12 oz sodas)         Gas: Cold/Hayfever:       Gas-X  Benadryl      Mylicon  Claritin       Phazyme  **Claritin-D        Chlor-Trimeton    Headaches:  Dimetapp      ASA-Free Excedrin  Drixoral-Non-Drowsy     Cold Compress  Mucinex (Guaifenasin)     Tylenol (Regular or Extra  Sudafed/Sudafed-12 Hour     Strength)  **Sudafed PE Pseudoephedrine   Tylenol Cold & Sinus     Vicks Vapor Rub  Zyrtec  **AVOID if  Problems With Blood Pressure  Heartburn: Avoid lying down for at least 1 hour after meals  Aciphex      Maalox     Rash:  Milk of Magnesia     Benadryl    Mylanta       1% Hydrocortisone Cream  Pepcid  Pepcid Complete   Sleep Aids:  Prevacid      Ambien   Prilosec       Benadryl  Rolaids       Chamomile Tea  Tums (Limit 4/day)     Unisom         Tylenol PM         Warm milk-add vanilla or  Hemorrhoids:       Sugar for taste  Anusol/Anusol H.C.  (RX: Analapram 2.5%)  Sugar Substitutes:  Hydrocortisone OTC     Ok in moderation  Preparation H      Tucks        Vaseline lotion applied to tissue with wiping    Herpes:     Throat:  Acyclovir      Oragel  Famvir  Valtrex     Vaccines:         Flu Shot Leg Cramps:       *Gardasil  Benadryl      Hepatitis A         Hepatitis B Nasal Spray:       Pneumovax  Saline Nasal Spray     Polio Booster         Tetanus Nausea:       Tuberculosis test or PPD  Vitamin B6 25 mg  TID   AVOID:    Dramamine      *Gardasil  Emetrol       Live Poliovirus  Ginger Root 250 mg QID    MMR (measles, mumps &  High Complex Carbs @ Bedtime    rebella)  Sea Bands-Accupressure    Varicella (Chickenpox)  Unisom 1/2 tab TID     *No known complications           If received before Pain:         Known pregnancy;   Darvocet       Resume series after  Lortab        Delivery  Percocet    Yeast:   Tramadol      Femstat  Tylenol 3      Gyne-lotrimin  Ultram       Monistat  Vicodin           MISC:         All Sunscreens           Hair Coloring/highlights          Insect Repellant's          (Including DEET)         [facility]  [address] [place], [place], [place] [address]  Phone: [phone]  [facility] (second location)  [address] [place], [place] [address]  Phone: [phone]  [facility]) [address] [place], [place], [place] [address] Phone: [phone]  [facility]  [address] [place], [place] [address]  Phone: [phone]

## 2023-10-07 LAB — COMPREHENSIVE METABOLIC PANEL
ALT: 18 [IU]/L (ref 0–32)
AST: 15 [IU]/L (ref 0–40)
Albumin: 4 g/dL (ref 3.9–4.9)
Alkaline Phosphatase: 65 [IU]/L (ref 44–121)
BUN/Creatinine Ratio: 13 (ref 9–23)
BUN: 6 mg/dL (ref 6–20)
Bilirubin Total: 0.3 mg/dL (ref 0.0–1.2)
CO2: 23 mmol/L (ref 20–29)
Calcium: 9.2 mg/dL (ref 8.7–10.2)
Chloride: 101 mmol/L (ref 96–106)
Creatinine, Ser: 0.45 mg/dL — ABNORMAL LOW (ref 0.57–1.00)
Globulin, Total: 2 g/dL (ref 1.5–4.5)
Glucose: 60 mg/dL — ABNORMAL LOW (ref 70–99)
Potassium: 4 mmol/L (ref 3.5–5.2)
Sodium: 136 mmol/L (ref 134–144)
Total Protein: 6 g/dL (ref 6.0–8.5)
eGFR: 130 mL/min/{1.73_m2} (ref >59)

## 2023-10-07 LAB — CBC/D/PLT+RPR+RH+ABO+RUBIGG...
Antibody Screen: NEGATIVE
Basophils Absolute: 0 10*3/uL (ref 0.0–0.2)
Basos: 0 %
EOS (ABSOLUTE): 0.3 10*3/uL (ref 0.0–0.4)
Eos: 4 %
HCV Ab: NONREACTIVE
HIV Screen 4th Generation wRfx: NONREACTIVE
Hematocrit: 38.6 % (ref 34.0–46.6)
Hemoglobin: 12.9 g/dL (ref 11.1–15.9)
Hepatitis B Surface Ag: NEGATIVE
Immature Grans (Abs): 0 10*3/uL (ref 0.0–0.1)
Immature Granulocytes: 0 %
Lymphocytes Absolute: 2.2 10*3/uL (ref 0.7–3.1)
Lymphs: 27 %
MCH: 29.9 pg (ref 26.6–33.0)
MCHC: 33.4 g/dL (ref 31.5–35.7)
MCV: 89 fL (ref 79–97)
Monocytes Absolute: 0.7 10*3/uL (ref 0.1–0.9)
Monocytes: 8 %
Neutrophils Absolute: 4.9 10*3/uL (ref 1.4–7.0)
Neutrophils: 61 %
Platelets: 284 10*3/uL (ref 150–450)
RBC: 4.32 x10E6/uL (ref 3.77–5.28)
RDW: 13.3 % (ref 11.7–15.4)
RPR Ser Ql: NONREACTIVE
Rh Factor: POSITIVE
Rubella Antibodies, IGG: 9.9 {index} (ref >0.99)
Varicella zoster IgG: REACTIVE
WBC: 8.1 10*3/uL (ref 3.4–10.8)

## 2023-10-07 LAB — CERVICOVAGINAL ANCILLARY ONLY
Chlamydia: NEGATIVE
Comment: NEGATIVE
Comment: NORMAL
Neisseria Gonorrhea: NEGATIVE

## 2023-10-07 LAB — URINALYSIS, ROUTINE W REFLEX MICROSCOPIC
Bilirubin, UA: NEGATIVE
Glucose, UA: NEGATIVE
Nitrite, UA: NEGATIVE
Protein,UA: NEGATIVE
RBC, UA: NEGATIVE
Specific Gravity, UA: 1.026 (ref 1.005–1.030)
Urobilinogen, Ur: 1 mg/dL (ref 0.2–1.0)
pH, UA: 6 (ref 5.0–7.5)

## 2023-10-07 LAB — PROTEIN / CREATININE RATIO, URINE
Creatinine, Urine: 159 mg/dL
Protein, Ur: 10.9 mg/dL
Protein/Creat Ratio: 69 mg/g{creat} (ref 0–200)

## 2023-10-07 LAB — MICROSCOPIC EXAMINATION
Casts: NONE SEEN /[LPF]
RBC, Urine: NONE SEEN /[HPF] (ref 0–2)

## 2023-10-07 LAB — HEMOGLOBIN A1C
Est. average glucose Bld gHb Est-mCnc: 100 mg/dL
Hgb A1c MFr Bld: 5.1 % (ref 4.8–5.6)

## 2023-10-07 LAB — TSH+FREE T4
Free T4: 0.89 ng/dL (ref 0.82–1.77)
TSH: 2.1 u[IU]/mL (ref 0.450–4.500)

## 2023-10-07 LAB — HCV INTERPRETATION

## 2023-10-08 ENCOUNTER — Encounter: Payer: Self-pay | Admitting: Certified Nurse Midwife

## 2023-10-08 LAB — CULTURE, OB URINE

## 2023-10-08 LAB — URINE CULTURE, OB REFLEX: Organism ID, Bacteria: NO GROWTH

## 2023-10-09 LAB — MONITOR DRUG PROFILE 14(MW)
Amphetamine Scrn, Ur: NEGATIVE ng/mL
BARBITURATE SCREEN URINE: NEGATIVE ng/mL
BENZODIAZEPINE SCREEN, URINE: NEGATIVE ng/mL
Buprenorphine, Urine: NEGATIVE ng/mL
CANNABINOIDS UR QL SCN: NEGATIVE ng/mL
Cocaine (Metab) Scrn, Ur: NEGATIVE ng/mL
Creatinine(Crt), U: 160.7 mg/dL (ref 20.0–300.0)
Fentanyl, Urine: NEGATIVE pg/mL
Meperidine Screen, Urine: NEGATIVE ng/mL
Methadone Screen, Urine: NEGATIVE ng/mL
OXYCODONE+OXYMORPHONE UR QL SCN: NEGATIVE ng/mL
Opiate Scrn, Ur: NEGATIVE ng/mL
Ph of Urine: 6.3 (ref 4.5–8.9)
Phencyclidine Qn, Ur: NEGATIVE ng/mL
Propoxyphene Scrn, Ur: NEGATIVE ng/mL
SPECIFIC GRAVITY: 1.018
Tramadol Screen, Urine: NEGATIVE ng/mL

## 2023-10-09 LAB — NICOTINE SCREEN, URINE: Cotinine Ql Scrn, Ur: NEGATIVE ng/mL

## 2023-11-03 ENCOUNTER — Ambulatory Visit (INDEPENDENT_AMBULATORY_CARE_PROVIDER_SITE_OTHER): Payer: No Typology Code available for payment source | Admitting: Advanced Practice Midwife

## 2023-11-03 ENCOUNTER — Encounter: Payer: Self-pay | Admitting: Advanced Practice Midwife

## 2023-11-03 VITALS — BP 134/73 | HR 76 | Wt 182.0 lb

## 2023-11-03 DIAGNOSIS — Z3A16 16 weeks gestation of pregnancy: Secondary | ICD-10-CM | POA: Diagnosis not present

## 2023-11-03 DIAGNOSIS — Z3482 Encounter for supervision of other normal pregnancy, second trimester: Secondary | ICD-10-CM

## 2023-11-03 DIAGNOSIS — Z8659 Personal history of other mental and behavioral disorders: Secondary | ICD-10-CM | POA: Insufficient documentation

## 2023-11-03 NOTE — Patient Instructions (Addendum)
 Alpha-Fetoprotein Test Why am I having this test? The alpha-fetoprotein test is a lab test most commonly used for pregnant women to help screen for birth defects in their unborn baby. It can be used to screen for chromosome (DNA) abnormalities, problems with the brain or spinal cord, or problems with the abdominal wall of the unborn baby (fetus). The alpha-fetoprotein test may also be done for men or nonpregnant women to check for certain cancers. What is being tested? This test measures the amount of alpha-fetoprotein (AFP) in your blood. AFP is a protein that is made by the liver. Levels can be detected in the mother's blood during pregnancy, starting at 10 weeks and peaking at 16-18 weeks of the pregnancy. Abnormal levels can sometimes be a sign of a birth defect in the baby. Certain cancers can cause a high level of AFP in men and nonpregnant women. What kind of sample is taken?  A blood sample is required for this test. It is usually collected by inserting a needle into a blood vessel. How are the results reported? Your test results will be reported as values. Your health care provider will compare your results to normal ranges that were established after testing a large group of people (reference values). Reference values may vary among labs and hospitals. For this test, common reference values are: Adult: Less than 40 ng/mL or less than 40 mcg/L (SI units). Child younger than 1 year: Less than 30 ng/mL. If you are pregnant, the values may also vary based on how long you have been pregnant. What do the results mean? Results that are above the reference values in pregnant women may indicate the following for the baby: Neural tube defects, such as abnormalities of the spinal cord or brain. Abdominal wall defects. Multiple pregnancy such as twins. Fetal distress or fetal death. Results that are above the reference values in men or nonpregnant women may indicate: Reproductive cancers, such as  ovarian or testicular cancer. Liver cancer. Liver cell death. Other types of cancer. Very low levels of AFP in pregnant women may indicate Down syndrome for the baby. Talk with your health care provider about what your results mean. Questions to ask your health care provider Ask your health care provider, or the department that is doing the test: When will my results be ready? How will I get my results? What are my treatment options? What other tests do I need? What are my next steps? Summary The alpha-fetoprotein test is done on pregnant women to help screen for birth defects in their unborn baby. Certain cancers can cause a high level of AFP in men and nonpregnant women. For this test, a blood sample is usually collected by inserting a needle into a blood vessel. Talk with your health care provider about what your results mean. This information is not intended to replace advice given to you by your health care provider. Make sure you discuss any questions you have with your health care provider. Document Revised: 02/25/2020 Document Reviewed: 02/25/2020 Elsevier Patient Education  2024 Elsevier Inc.Second Trimester of Pregnancy  The second trimester of pregnancy is from week 14 through week 27. This is months 4 through 6 of pregnancy. During the second trimester: Morning sickness is less or has stopped. You may have more energy. You may feel hungry more often. At this time, your unborn baby is growing very fast. At the end of the sixth month, the unborn baby may be up to 12 inches long and weigh about 1  pounds. You will likely start to feel the baby move between 16 and 20 weeks of pregnancy. Body changes during your second trimester Your body continues to change during this time. The changes usually go away after your baby is born. Physical changes You will gain more weight. Your belly will get bigger. You may begin to get stretch marks on your hips, belly, and breasts. Your  breasts will keep growing and may hurt. You may get dark spots or blotches on your face. A dark line from your belly button to the pubic area may appear. This line is called linea nigra. Your hair may grow faster and get thicker. Health changes You may have headaches. You may have heartburn. You may pee more often. You may have swollen, bulging veins (varicose veins). You may have trouble pooping (constipation), or swollen veins in the butt that can itch or get painful (hemorrhoids). You may have back pain. This is caused by: Weight gain. Pregnancy hormones that are relaxing the joints in your pelvis. Follow these instructions at home: Medicines Talk to your health care provider if you're taking medicines. Ask if the medicines are safe to take during pregnancy. Your provider may change the medicines that you take. Do not take any medicines unless told to by your provider. Take a prenatal vitamin that has at least 600 micrograms (mcg) of folic acid. Do not use herbal medicines, illegal drugs, or medicines that are not approved by your provider. Eating and drinking While you're pregnant your body needs extra food for your growing baby. Talk with your provider about what to eat while pregnant. Activity Most women are able to exercise during pregnancy. Exercises may need to change as your pregnancy goes on. Talk to your provider about your activities and exercise routines. Relieving pain and discomfort Wear a good, supportive bra if your breasts hurt. Rest with your legs raised if you have leg cramps or low back pain. Take warm sitz baths to soothe pain from hemorrhoids. Use hemorrhoid cream if your provider says it's okay. Do not douche. Do not use tampons or scented pads. Do not use hot tubs, steam rooms, or saunas. Safety Wear your seatbelt at all times when you're in a car. Talk to your provider if someone hits you, hurts you, or yells at you. Talk with your provider if you're  feeling sad or have thoughts of hurting yourself. Lifestyle Certain things can be harmful while you're pregnant. It's best to avoid the following: Do not drink alcohol,smoke, vape, or use products with nicotine or tobacco in them. If you need help quitting, talk with your provider. Avoid cat litter boxes and soil used by cats. These things carry germs that can cause harm to your pregnancy and your baby. General instructions Keep all follow-up visits. It helps you and your unborn baby stay as healthy as possible. Write down your questions. Take them to your prenatal visits. Your provider will: Talk with you about your overall health. Give you advice or refer you to specialists who can help with different needs, including: Prenatal education classes. Mental health and counseling. Foods and healthy eating. Ask for help if you need help with food. Where to find more information American Pregnancy Association: americanpregnancy.org [employer]: acog.org Office on [employer] Health: [employer] Contact a health care provider if: You have a headache that does not go away when you take medicine. You have any of these problems: You can't eat or drink. You throw up or feel  like you may throw up. You have watery poop (diarrhea) for 2 days or more. You have pain when you pee or your pee smells bad. You have been sick for 2 days or more and are not getting better. Contact your provider right away if: You have any of these coming from your vagina: Abnormal discharge. Bad-smelling fluid. Bleeding. Your baby is moving less than usual. You have contractions, belly cramping, or have pain in your pelvis or lower back. You have symptoms of high blood pressure or preeclampsia. These include: A severe, throbbing headache that does not go away. Sudden or extreme swelling of your face, hands, legs, or feet. Vision problems: You see spots. You have blurry  vision. Your eyes are sensitive to light. If you can't reach the provider, go to an urgent care or emergency room. Get help right away if: You faint, become confused, or can't think clearly. You have chest pain or trouble breathing. You have any kind of injury, such as from a fall or a car crash. These symptoms may be an emergency. Call 911 right away. Do not wait to see if the symptoms will go away. Do not drive yourself to the hospital. This information is not intended to replace advice given to you by your health care provider. Make sure you discuss any questions you have with your health care provider. Document Revised: 05/08/2023 Document Reviewed: 12/06/2022 Elsevier Patient Education  2024 [employer].

## 2023-11-03 NOTE — Progress Notes (Signed)
 Routine Prenatal Care Visit  Subjective  Aimee Jackson is a 33 y.o. G3P1011 at [redacted]w[redacted]d being seen today for ongoing prenatal care.  She is currently monitored for the following issues for this low-risk pregnancy and has Supervision of other normal pregnancy, antepartum; History of depression; and Anxiety, generalized on their problem list.  ----------------------------------------------------------------------------------- Patient reports having sinus symptoms- drainage, pressure. Started taking zyrtec. Reviewed safe medication list and comfort measures. She did not start baby asa due to NSAID allergy. Discussed that based on pre-pregnant BMI technically she did not meet criteria. She prefers to not take baby asa. Reviewed importance of dietary protein, limit salt intake, stay physically active.  Did not do early genetic screen due to insurance coverage. Had an outside u/s and thinks it is a girl. She is scheduled for anatomy scan in a couple of weeks. She will check with insurance regarding 2nd trimester screen and may make lab-only visit if insurance covers.   . Vag. Bleeding: None.   . Leaking Fluid denies.  ----------------------------------------------------------------------------------- The following portions of the patient's history were reviewed and updated as appropriate: allergies, current medications, past family history, past medical history, past social history, past surgical history and problem list. Problem list updated.  Objective  Blood pressure 134/73, pulse 76, weight 182 lb (82.6 kg), last menstrual period 07/08/2023. Pregravid weight 171 lb (77.6 kg) Total Weight Gain 11 lb (4.99 kg) Urinalysis: Urine Protein    Urine Glucose    Fetal Status: Fetal Heart Rate (bpm): 149         General:  Alert, oriented and cooperative. Patient is in no acute distress.  Skin: Skin is warm and dry. No rash noted.   Cardiovascular: Normal heart rate noted  Respiratory: Normal respiratory  effort, no problems with respiration noted  Abdomen: Soft, gravid, appropriate for gestational age. Pain/Pressure: Absent     Pelvic:  Cervical exam deferred        Extremities: Normal range of motion.  Edema: None  Mental Status: Normal mood and affect. Normal behavior. Normal judgment and thought content.   Assessment   33 y.o. G3P1011 at [redacted]w[redacted]d by  04/13/2024, by Last Menstrual Period presenting for routine prenatal visit  Plan   G3 Problems (from 09/12/23 to present)     Problem Noted Diagnosed Resolved   Supervision of other normal pregnancy, antepartum 07/06/2018 by Loman Chroman, CMA  No   Overview Addendum 11/03/2023  9:36 AM by Donnetta Hail, CMA    Clinical Staff Provider  Office Location  [facility] Dating  04/13/2024, by Last Menstrual Period  Language  English Anatomy US    Flu Vaccine  Declined Genetic Screen  NIPS:   RSV Vaccine  N/A    Covid Vaccine  Declined    TDaP vaccine   Offer Hgb A1C or  GTT Early : Third trimester :   Covid    LAB RESULTS   Rhogam  A/Positive/-- (02/17 1541)  Blood Type A/Positive/-- (02/17 1541)   RSV  Antibody Negative (02/17 1541)  Feeding Plan Breastfeed Rubella 9.90 (02/17 1541)  Contraception Tubal Ligation RPR Non Reactive (02/17 1541)   Circumcision Yes HBsAg Negative (02/17 1541)   Pediatrician  [facility] HIV Non Reactive (02/17 1541)  Support Person Husband Varicella Reactive (02/17 1541)  Prenatal Classes No GBS  (For PCN allergy, check sensitivities)     Hep C Non Reactive (02/17 1541)   BTL Consent  Pap No results found for: "DIAGPAP"  VBAC Consent  Hgb Electro  CF      SMA                    Preterm labor symptoms and general obstetric precautions including but not limited to vaginal bleeding, contractions, leaking of fluid and fetal movement were reviewed in detail with the patient. Please refer to After Visit Summary for other counseling recommendations.   Return in about 4 weeks (around  12/01/2023) for rob, has anatomy scan already scheduled.  Tresea Mall, CNM 11/03/2023 10:14 AM

## 2023-11-15 ENCOUNTER — Encounter: Payer: Self-pay | Admitting: Intensive Care

## 2023-11-15 ENCOUNTER — Other Ambulatory Visit: Payer: Self-pay

## 2023-11-15 ENCOUNTER — Emergency Department
Admission: EM | Admit: 2023-11-15 | Discharge: 2023-11-15 | Disposition: A | Discharge status: Home / Self Care | Attending: Student in an Organized Health Care Education/Training Program | Admitting: Student in an Organized Health Care Education/Training Program

## 2023-11-15 DIAGNOSIS — O26892 Other specified pregnancy related conditions, second trimester: Secondary | ICD-10-CM | POA: Diagnosis present

## 2023-11-15 DIAGNOSIS — R002 Palpitations: Secondary | ICD-10-CM | POA: Insufficient documentation

## 2023-11-15 DIAGNOSIS — Z3A18 18 weeks gestation of pregnancy: Secondary | ICD-10-CM | POA: Diagnosis not present

## 2023-11-15 LAB — COMPREHENSIVE METABOLIC PANEL WITH GFR
ALT: 18 U/L (ref 0–44)
AST: 18 U/L (ref 15–41)
Albumin: 3.6 g/dL (ref 3.5–5.0)
Alkaline Phosphatase: 57 U/L (ref 38–126)
Anion gap: 8 (ref 5–15)
BUN: 7 mg/dL (ref 6–20)
CO2: 24 mmol/L (ref 22–32)
Calcium: 9.3 mg/dL (ref 8.9–10.3)
Chloride: 104 mmol/L (ref 98–111)
Creatinine, Ser: 0.52 mg/dL (ref 0.44–1.00)
GFR, Estimated: >60 mL/min (ref >60)
Glucose, Bld: 87 mg/dL (ref 70–99)
Potassium: 3.6 mmol/L (ref 3.5–5.1)
Sodium: 136 mmol/L (ref 135–145)
Total Bilirubin: 0.4 mg/dL (ref 0.0–1.2)
Total Protein: 7.2 g/dL (ref 6.5–8.1)

## 2023-11-15 LAB — CBC WITH DIFFERENTIAL/PLATELET
Abs Immature Granulocytes: 0.03 10*3/uL (ref 0.00–0.07)
Basophils Absolute: 0 10*3/uL (ref 0.0–0.1)
Basophils Relative: 0 %
Eosinophils Absolute: 0.1 10*3/uL (ref 0.0–0.5)
Eosinophils Relative: 1 %
HCT: 37.1 % (ref 36.0–46.0)
Hemoglobin: 13 g/dL (ref 12.0–15.0)
Immature Granulocytes: 0 %
Lymphocytes Relative: 12 %
Lymphs Abs: 1.4 10*3/uL (ref 0.7–4.0)
MCH: 31.1 pg (ref 26.0–34.0)
MCHC: 35 g/dL (ref 30.0–36.0)
MCV: 88.8 fL (ref 80.0–100.0)
Monocytes Absolute: 0.5 10*3/uL (ref 0.1–1.0)
Monocytes Relative: 4 %
Neutro Abs: 10.3 10*3/uL — ABNORMAL HIGH (ref 1.7–7.7)
Neutrophils Relative %: 83 %
Platelets: 279 10*3/uL (ref 150–400)
RBC: 4.18 MIL/uL (ref 3.87–5.11)
RDW: 13.8 % (ref 11.5–15.5)
WBC: 12.3 10*3/uL — ABNORMAL HIGH (ref 4.0–10.5)
nRBC: 0 % (ref 0.0–0.2)

## 2023-11-15 LAB — TROPONIN I (HIGH SENSITIVITY): Troponin I (High Sensitivity): 3 ng/L (ref <18)

## 2023-11-15 NOTE — ED Triage Notes (Signed)
 Patient reports intermittent "waves" of near syncope, chest tightness and left side numbness. Patient is [redacted] weeks pregnant. Takes klonopin as needed. Patient tearful in triage.   Reports these same symptoms had intermittently been going on for years and placed on anxiety medication. Reports she has not had an episode in a year until now.

## 2023-11-15 NOTE — ED Provider Notes (Signed)
 [facility] Provider Note    Event Date/Time   First MD Initiated Contact with Patient 11/15/23 1320     (approximate)   History   Chest Pain   HPI  Aimee Jackson is a 33 y.o. female who is [redacted] weeks pregnant presents to the ER for evaluation of waves of what she describes her panic attacks and anxiety.  Patient states that she gets very tremulous and shaky and sweaty as well.  Feels some palpitations.  She took Klonopin on Tuesday as well as 1 today both of which help her symptoms.  She is on 25 mg of Zoloft but has been reluctant to increase that medication.  She denies any fevers.  No abdominal pain.  No leakage of fluid or vaginal bleeding.     Physical Exam   Triage Vital Signs: ED Triage Vitals  Encounter Vitals Group     BP 11/15/23 1300 (!) 127/95     Systolic BP Percentile --      Diastolic BP Percentile --      Pulse Rate 11/15/23 1300 81     Resp 11/15/23 1300 18     Temp 11/15/23 1304 98.5 F (36.9 C)     Temp Source 11/15/23 1304 Oral     SpO2 11/15/23 1300 100 %     Weight 11/15/23 1301 182 lb (82.6 kg)     Height 11/15/23 1301 5\' 5"  (1.651 m)     Head Circumference --      Peak Flow --      Pain Score 11/15/23 1301 3     Pain Loc --      Pain Education --      Exclude from Growth Chart --     Most recent vital signs: Vitals:   11/15/23 1300 11/15/23 1304  BP: (!) 127/95   Pulse: 81   Resp: 18   Temp:  98.5 F (36.9 C)  SpO2: 100%      Constitutional: Alert  Eyes: Conjunctivae are normal.  Head: Atraumatic. Nose: No congestion/rhinnorhea. Mouth/Throat: Mucous membranes are moist.   Neck: Painless ROM.  Cardiovascular:   Good peripheral circulation. Respiratory: Normal respiratory effort.  No retractions.  Gastrointestinal: Soft and nontender.  Musculoskeletal:  no deformity Neurologic:  MAE spontaneously. No gross focal neurologic deficits are appreciated.  Skin:  Skin is warm, dry and intact. No rash  noted. Psychiatric: Mood and affect are anxious. Speech and behavior are normal.    ED Results / Procedures / Treatments   Labs (all labs ordered are listed, but only abnormal results are displayed) Labs Reviewed  CBC WITH DIFFERENTIAL/PLATELET - Abnormal; Notable for the following components:      Result Value   WBC 12.3 (*)    Neutro Abs 10.3 (*)    All other components within normal limits  COMPREHENSIVE METABOLIC PANEL WITH GFR  TROPONIN I (HIGH SENSITIVITY)     EKG  ED ECG REPORT I, Willy Eddy, the attending physician, personally viewed and interpreted this ECG.   Date: 11/15/2023  EKG Time: 13:01  Rate: 80  Rhythm: sinus  Axis: normal  Intervals: normal  ST&T Change: no stemi, no depressions    RADIOLOGY Please see ED Course for my review and interpretation.  I personally reviewed all radiographic images ordered to evaluate for the above acute complaints and reviewed radiology reports and findings.  These findings were personally discussed with the patient.  Please see medical record for radiology report.  PROCEDURES:  Critical Care performed: No  Procedures   MEDICATIONS ORDERED IN ED: Medications - No data to display   IMPRESSION / MDM / ASSESSMENT AND PLAN / ED COURSE  I reviewed the triage vital signs and the nursing notes.                              Differential diagnosis includes, but is not limited to, dysrhythmia, electrolyte abnormality, anxiety, panic attacks, anemia, pregnancy,  Patient presenting to the ER for evaluation of symptoms as described above.  Based on symptoms, risk factors and considered above differential, this presenting complaint could reflect a potentially life-threatening illness therefore the patient will be placed on continuous pulse oximetry and telemetry for monitoring.  Laboratory evaluation will be sent to evaluate for the above complaints.  Patient well-appearing clinically does appear anxious.  Her exam  and history seems most consistent with anxiety and panic attacks.  I have considered PE as well as cardiac pulmonary etiology but given her presentation and evaluation feels exceedingly unlikely is the true etiology of her symptoms.  She does have establish care with therapist as well as OB/GYN.  Fetal heart tones are normal.  Do not feel that further diagnostic testing clinically indicated here in the ER.  She does appear appropriate for outpatient follow-up.       FINAL CLINICAL IMPRESSION(S) / ED DIAGNOSES   Final diagnoses:  Palpitations     Rx / DC Orders   ED Discharge Orders     None        Note:  This document was prepared using Dragon voice recognition software and may include unintentional dictation errors.    Willy Eddy, MD 11/15/23 [phone]

## 2023-11-21 ENCOUNTER — Ambulatory Visit (INDEPENDENT_AMBULATORY_CARE_PROVIDER_SITE_OTHER): Payer: No Typology Code available for payment source | Admitting: Certified Nurse Midwife

## 2023-11-21 ENCOUNTER — Ambulatory Visit: Payer: No Typology Code available for payment source

## 2023-11-21 VITALS — BP 109/74 | HR 68 | Wt 186.7 lb

## 2023-11-21 DIAGNOSIS — Z363 Encounter for antenatal screening for malformations: Secondary | ICD-10-CM | POA: Diagnosis not present

## 2023-11-21 DIAGNOSIS — O99342 Other mental disorders complicating pregnancy, second trimester: Secondary | ICD-10-CM | POA: Diagnosis not present

## 2023-11-21 DIAGNOSIS — Z3480 Encounter for supervision of other normal pregnancy, unspecified trimester: Secondary | ICD-10-CM

## 2023-11-21 DIAGNOSIS — Z3A19 19 weeks gestation of pregnancy: Secondary | ICD-10-CM

## 2023-11-21 DIAGNOSIS — F411 Generalized anxiety disorder: Secondary | ICD-10-CM | POA: Diagnosis not present

## 2023-11-21 DIAGNOSIS — O3503X0 Maternal care for (suspected) central nervous system malformation or damage in fetus, choroid plexus cysts, not applicable or unspecified: Secondary | ICD-10-CM

## 2023-11-21 DIAGNOSIS — Z1379 Encounter for other screening for genetic and chromosomal anomalies: Secondary | ICD-10-CM

## 2023-11-21 DIAGNOSIS — O35EXX0 Maternal care for other (suspected) fetal abnormality and damage, fetal genitourinary anomalies, not applicable or unspecified: Secondary | ICD-10-CM

## 2023-11-21 DIAGNOSIS — Z3482 Encounter for supervision of other normal pregnancy, second trimester: Secondary | ICD-10-CM | POA: Diagnosis not present

## 2023-11-23 DIAGNOSIS — O35EXX0 Maternal care for other (suspected) fetal abnormality and damage, fetal genitourinary anomalies, not applicable or unspecified: Secondary | ICD-10-CM | POA: Insufficient documentation

## 2023-11-23 NOTE — Assessment & Plan Note (Signed)
 Zoloft increased to 50mg  daily, continue at this dose per psychiatric provider. Consider buspirone in addition if continued frequent klonopin use is needed as this has worked well for her in the past.

## 2023-11-23 NOTE — Progress Notes (Signed)
    Return Prenatal Note   Subjective   33 y.o. G3P1011 at [redacted]w[redacted]d presents for this follow-up prenatal visit.  Patient reports increased anxiety with panic attacks. She has increased her Zoloft dose to 50mg , increased on Sunday. Feels it is starting to make a difference. Has needed klonopin twice in last two weeks due to panic attacks. Discussed use of Buspar in pregnancy would be preferred to frequent klonopin use even though there is limited studies available on use in pregnancy.    Patient reports: Movement: Present Contractions: Not present  Objective   Flow sheet Vitals: Pulse Rate: 68 BP: 109/74 Fetal Heart Rate (bpm): 150 Total weight gain: 15 lb 11.2 oz (7.121 kg)  General Appearance  No acute distress, well appearing, and well nourished Pulmonary   Normal work of breathing Neurologic   Alert and oriented to person, place, and time Psychiatric   Mood and affect within normal limits  Assessment/Plan   Plan  33 y.o. G3P1011 at [redacted]w[redacted]d presents for follow-up OB visit. Reviewed prenatal record including previous visit note.  Supervision of other normal pregnancy, antepartum Red flag symptoms reviewed. MFM referral ordered for follow up ultrasound.  Anxiety, generalized Zoloft increased to 50mg  daily, continue at this dose per psychiatric provider. Consider buspirone in addition if continued frequent klonopin use is needed as this has worked well for her in the past.      Orders Placed This Encounter  Procedures   [place] MFM OB DETAIL +14 WK    Standing Status:   Future    Expected Date:   12/21/2023    Expiration Date:   11/20/2024    Reason for Exam (SYMPTOM  OR DIAGNOSIS REQUIRED):   choroid plexus cyst & fetal pyelectasis on anatomy ultrasound; NIPT drawn 4/4    Preferred Location:   Perinatal Consultation [facility] @ [facility]   MaterniT 21 plus Core, Blood    Is patient insulin dependent?:   No    Weight (lbs):   186    Gestational Age (GA), weeks:   40    Date  on which patient was at this GA:   11/18/2023    GA Calculation Method:   LMP    GA Calculation Method:   Ultrasound    Number of fetuses:   1    Reason for screen:   OTHER             choroid plexus cyst & pyelectasis on u/s    Donor egg?:   N   Return in about 4 weeks (around 12/19/2023) for ROB.   Future Appointments  Date Time Provider Department Center  12/01/2023 10:15 AM Dominica Severin, CNM [facility] None    For next visit:  continue with routine prenatal care, MFM referral ordered     Dominica Severin, CNM  04/06/251:40 PM

## 2023-11-23 NOTE — Patient Instructions (Signed)
 Second Trimester of Pregnancy  The second trimester of pregnancy is from week 14 through week 27. This is months 4 through 6 of pregnancy. During the second trimester: Morning sickness is less or has stopped. You may have more energy. You may feel hungry more often. At this time, your unborn baby is growing very fast. At the end of the sixth month, the unborn baby may be up to 12 inches long and weigh about 1 pounds. You will likely start to feel the baby move between 16 and 20 weeks of pregnancy. Body changes during your second trimester Your body continues to change during this time. The changes usually go away after your baby is born. Physical changes You will gain more weight. Your belly will get bigger. You may begin to get stretch marks on your hips, belly, and breasts. Your breasts will keep growing and may hurt. You may get dark spots or blotches on your face. A dark line from your belly button to the pubic area may appear. This line is called linea nigra. Your hair may grow faster and get thicker. Health changes You may have headaches. You may have heartburn. You may pee more often. You may have swollen, bulging veins (varicose veins). You may have trouble pooping (constipation), or swollen veins in the butt that can itch or get painful (hemorrhoids). You may have back pain. This is caused by: Weight gain. Pregnancy hormones that are relaxing the joints in your pelvis. Follow these instructions at home: Medicines Talk to your health care provider if you're taking medicines. Ask if the medicines are safe to take during pregnancy. Your provider may change the medicines that you take. Do not take any medicines unless told to by your provider. Take a prenatal vitamin that has at least 600 micrograms (mcg) of folic acid. Do not use herbal medicines, illegal drugs, or medicines that are not approved by your provider. Eating and drinking While you're pregnant your body needs  extra food for your growing baby. Talk with your provider about what to eat while pregnant. Activity Most women are able to exercise during pregnancy. Exercises may need to change as your pregnancy goes on. Talk to your provider about your activities and exercise routines. Relieving pain and discomfort Wear a good, supportive bra if your breasts hurt. Rest with your legs raised if you have leg cramps or low back pain. Take warm sitz baths to soothe pain from hemorrhoids. Use hemorrhoid cream if your provider says it's okay. Do not douche. Do not use tampons or scented pads. Do not use hot tubs, steam rooms, or saunas. Safety Wear your seatbelt at all times when you're in a car. Talk to your provider if someone hits you, hurts you, or yells at you. Talk with your provider if you're feeling sad or have thoughts of hurting yourself. Lifestyle Certain things can be harmful while you're pregnant. It's best to avoid the following: Do not drink alcohol,smoke, vape, or use products with nicotine or tobacco in them. If you need help quitting, talk with your provider. Avoid cat litter boxes and soil used by cats. These things carry germs that can cause harm to your pregnancy and your baby. General instructions Keep all follow-up visits. It helps you and your unborn baby stay as healthy as possible. Write down your questions. Take them to your prenatal visits. Your provider will: Talk with you about your overall health. Give you advice or refer you to specialists who can help with different needs,  including: Prenatal education classes. Mental health and counseling. Foods and healthy eating. Ask for help if you need help with food. Where to find more information American Pregnancy Association: americanpregnancy.org [employer]: acog.org Office on [employer] Health: [employer] Contact a health care provider if: You have a headache that does not go away  when you take medicine. You have any of these problems: You can't eat or drink. You throw up or feel like you may throw up. You have watery poop (diarrhea) for 2 days or more. You have pain when you pee or your pee smells bad. You have been sick for 2 days or more and are not getting better. Contact your provider right away if: You have any of these coming from your vagina: Abnormal discharge. Bad-smelling fluid. Bleeding. Your baby is moving less than usual. You have contractions, belly cramping, or have pain in your pelvis or lower back. You have symptoms of high blood pressure or preeclampsia. These include: A severe, throbbing headache that does not go away. Sudden or extreme swelling of your face, hands, legs, or feet. Vision problems: You see spots. You have blurry vision. Your eyes are sensitive to light. If you can't reach the provider, go to an urgent care or emergency room. Get help right away if: You faint, become confused, or can't think clearly. You have chest pain or trouble breathing. You have any kind of injury, such as from a fall or a car crash. These symptoms may be an emergency. Call 911 right away. Do not wait to see if the symptoms will go away. Do not drive yourself to the hospital. This information is not intended to replace advice given to you by your health care provider. Make sure you discuss any questions you have with your health care provider. Document Revised: 05/08/2023 Document Reviewed: 12/06/2022 Elsevier Patient Education  2024 [employer].

## 2023-11-23 NOTE — Assessment & Plan Note (Signed)
 Red flag symptoms reviewed. MFM referral ordered for follow up ultrasound.

## 2023-11-26 ENCOUNTER — Encounter: Payer: Self-pay | Admitting: Certified Nurse Midwife

## 2023-11-26 LAB — MATERNIT 21 PLUS CORE, BLOOD
Fetal Fraction: 13
Result (T21): NEGATIVE
Trisomy 13 (Patau syndrome): NEGATIVE
Trisomy 18 (Edwards syndrome): NEGATIVE
Trisomy 21 (Down syndrome): NEGATIVE

## 2023-11-27 ENCOUNTER — Encounter: Payer: Self-pay | Admitting: Certified Nurse Midwife

## 2023-12-01 ENCOUNTER — Ambulatory Visit (INDEPENDENT_AMBULATORY_CARE_PROVIDER_SITE_OTHER): Admitting: Certified Nurse Midwife

## 2023-12-01 VITALS — BP 115/67 | HR 79 | Wt 185.2 lb

## 2023-12-01 DIAGNOSIS — Z3A20 20 weeks gestation of pregnancy: Secondary | ICD-10-CM | POA: Diagnosis not present

## 2023-12-01 DIAGNOSIS — Z8659 Personal history of other mental and behavioral disorders: Secondary | ICD-10-CM | POA: Diagnosis not present

## 2023-12-01 DIAGNOSIS — F411 Generalized anxiety disorder: Secondary | ICD-10-CM

## 2023-12-01 DIAGNOSIS — Z3480 Encounter for supervision of other normal pregnancy, unspecified trimester: Secondary | ICD-10-CM | POA: Diagnosis not present

## 2023-12-01 NOTE — Assessment & Plan Note (Signed)
 Red flag symptoms reviewed.

## 2023-12-01 NOTE — Progress Notes (Signed)
    Return Prenatal Note   Subjective   33 y.o. G3P1011 at [redacted]w[redacted]d presents for this follow-up prenatal visit.  Patient feeling well, mood stable, has not needed prn klonopin since last visit. Reviewed NIPT results-screen negative. Has MFM ultrasound scheduled. Patient reports: Movement: Present Contractions: Not present  Objective   Flow sheet Vitals: Pulse Rate: 79 BP: 115/67 Fundal Height:  (@U ) Fetal Heart Rate (bpm): 155 Total weight gain: 14 lb 3.2 oz (6.441 kg)  General Appearance  No acute distress, well appearing, and well nourished Pulmonary   Normal work of breathing Neurologic   Alert and oriented to person, place, and time Psychiatric   Mood and affect within normal limits  Assessment/Plan   Plan  33 y.o. G3P1011 at [redacted]w[redacted]d presents for follow-up OB visit. Reviewed prenatal record including previous visit note.  Supervision of other normal pregnancy, antepartum Red flag symptoms reviewed.   History of depression Continue on zoloft 50mg  daily.  Anxiety, generalized Continue on zoloft 50mg  daily      No orders of the defined types were placed in this encounter.  Return in 4 weeks (on 12/29/2023) for ROB.   Future Appointments  Date Time Provider Department Center  12/29/2023  8:15 AM Slaughterbeck, Sherline Distel, CNM [facility] None  01/05/2024  8:00 AM [facility] NURSE INTAKE [facility] None  01/07/2024  8:00 AM [facility]1 [facility]    For next visit:  continue with routine prenatal care     Forestine Igo, CNM  12/01/2510:15 PM

## 2023-12-01 NOTE — Assessment & Plan Note (Signed)
 Continue on zoloft 50mg  daily

## 2023-12-01 NOTE — Patient Instructions (Signed)
 Second Trimester of Pregnancy  The second trimester of pregnancy is from week 14 through week 27. This is months 4 through 6 of pregnancy. During the second trimester: Morning sickness is less or has stopped. You may have more energy. You may feel hungry more often. At this time, your unborn baby is growing very fast. At the end of the sixth month, the unborn baby may be up to 12 inches long and weigh about 1 pounds. You will likely start to feel the baby move between 16 and 20 weeks of pregnancy. Body changes during your second trimester Your body continues to change during this time. The changes usually go away after your baby is born. Physical changes You will gain more weight. Your belly will get bigger. You may begin to get stretch marks on your hips, belly, and breasts. Your breasts will keep growing and may hurt. You may get dark spots or blotches on your face. A dark line from your belly button to the pubic area may appear. This line is called linea nigra. Your hair may grow faster and get thicker. Health changes You may have headaches. You may have heartburn. You may pee more often. You may have swollen, bulging veins (varicose veins). You may have trouble pooping (constipation), or swollen veins in the butt that can itch or get painful (hemorrhoids). You may have back pain. This is caused by: Weight gain. Pregnancy hormones that are relaxing the joints in your pelvis. Follow these instructions at home: Medicines Talk to your health care provider if you're taking medicines. Ask if the medicines are safe to take during pregnancy. Your provider may change the medicines that you take. Do not take any medicines unless told to by your provider. Take a prenatal vitamin that has at least 600 micrograms (mcg) of folic acid. Do not use herbal medicines, illegal drugs, or medicines that are not approved by your provider. Eating and drinking While you're pregnant your body needs  extra food for your growing baby. Talk with your provider about what to eat while pregnant. Activity Most women are able to exercise during pregnancy. Exercises may need to change as your pregnancy goes on. Talk to your provider about your activities and exercise routines. Relieving pain and discomfort Wear a good, supportive bra if your breasts hurt. Rest with your legs raised if you have leg cramps or low back pain. Take warm sitz baths to soothe pain from hemorrhoids. Use hemorrhoid cream if your provider says it's okay. Do not douche. Do not use tampons or scented pads. Do not use hot tubs, steam rooms, or saunas. Safety Wear your seatbelt at all times when you're in a car. Talk to your provider if someone hits you, hurts you, or yells at you. Talk with your provider if you're feeling sad or have thoughts of hurting yourself. Lifestyle Certain things can be harmful while you're pregnant. It's best to avoid the following: Do not drink alcohol,smoke, vape, or use products with nicotine or tobacco in them. If you need help quitting, talk with your provider. Avoid cat litter boxes and soil used by cats. These things carry germs that can cause harm to your pregnancy and your baby. General instructions Keep all follow-up visits. It helps you and your unborn baby stay as healthy as possible. Write down your questions. Take them to your prenatal visits. Your provider will: Talk with you about your overall health. Give you advice or refer you to specialists who can help with different needs,  including: Prenatal education classes. Mental health and counseling. Foods and healthy eating. Ask for help if you need help with food. Where to find more information American Pregnancy Association: americanpregnancy.org [employer]: acog.org Office on [employer] Health: [employer] Contact a health care provider if: You have a headache that does not go away  when you take medicine. You have any of these problems: You can't eat or drink. You throw up or feel like you may throw up. You have watery poop (diarrhea) for 2 days or more. You have pain when you pee or your pee smells bad. You have been sick for 2 days or more and are not getting better. Contact your provider right away if: You have any of these coming from your vagina: Abnormal discharge. Bad-smelling fluid. Bleeding. Your baby is moving less than usual. You have contractions, belly cramping, or have pain in your pelvis or lower back. You have symptoms of high blood pressure or preeclampsia. These include: A severe, throbbing headache that does not go away. Sudden or extreme swelling of your face, hands, legs, or feet. Vision problems: You see spots. You have blurry vision. Your eyes are sensitive to light. If you can't reach the provider, go to an urgent care or emergency room. Get help right away if: You faint, become confused, or can't think clearly. You have chest pain or trouble breathing. You have any kind of injury, such as from a fall or a car crash. These symptoms may be an emergency. Call 911 right away. Do not wait to see if the symptoms will go away. Do not drive yourself to the hospital. This information is not intended to replace advice given to you by your health care provider. Make sure you discuss any questions you have with your health care provider. Document Revised: 05/08/2023 Document Reviewed: 12/06/2022 Elsevier Patient Education  2024 [employer].

## 2023-12-16 ENCOUNTER — Encounter: Payer: Self-pay | Admitting: Licensed Practical Nurse

## 2023-12-16 ENCOUNTER — Ambulatory Visit (INDEPENDENT_AMBULATORY_CARE_PROVIDER_SITE_OTHER): Admitting: Licensed Practical Nurse

## 2023-12-16 VITALS — BP 108/73 | HR 71 | Wt 185.7 lb

## 2023-12-16 DIAGNOSIS — Z3A23 23 weeks gestation of pregnancy: Secondary | ICD-10-CM | POA: Diagnosis not present

## 2023-12-16 DIAGNOSIS — R399 Unspecified symptoms and signs involving the genitourinary system: Secondary | ICD-10-CM

## 2023-12-16 DIAGNOSIS — O368120 Decreased fetal movements, second trimester, not applicable or unspecified: Secondary | ICD-10-CM | POA: Diagnosis not present

## 2023-12-16 DIAGNOSIS — Z3480 Encounter for supervision of other normal pregnancy, unspecified trimester: Secondary | ICD-10-CM

## 2023-12-16 LAB — POCT URINALYSIS DIPSTICK
Bilirubin, UA: NEGATIVE
Blood, UA: NEGATIVE
Glucose, UA: NEGATIVE
Ketones, UA: NEGATIVE
Leukocytes, UA: NEGATIVE
Nitrite, UA: NEGATIVE
Protein, UA: NEGATIVE
Spec Grav, UA: 1.015 (ref 1.010–1.025)
Urobilinogen, UA: 0.2 U/dL
pH, UA: 6.5 (ref 5.0–8.0)

## 2023-12-16 NOTE — Progress Notes (Signed)
    Return Prenatal Note   Subjective   33 y.o. G3P1011 at [redacted]w[redacted]d presents for this follow-up prenatal visit.  Patient Here with her husband. Has not felt movement in a few days. On her previous pregnancy her fetus moved "all of the time". Aimee Jackson has been active herself recently. She had noticed a little tightening in her abdomen, but it did not last long and has not come back. Has a vericose view on her Right leg, has compression stockings.  Patient reports: Movement: (!) Decreased Contractions: Not present  Objective   Flow sheet Vitals: Pulse Rate: 71 BP: 108/73 Fetal Heart Rate (bpm): 137 Total weight gain: 14 lb 11.2 oz (6.668 kg)  General Appearance  No acute distress, well appearing, and well nourished Pulmonary   Normal work of breathing Neurologic   Alert and oriented to person, place, and time Psychiatric   Mood and affect within normal limits  Assessment/Plan   Plan  33 y.o. G3P1011 at [redacted]w[redacted]d presents for follow-up OB visit. Reviewed prenatal record including previous visit note.  Supervision of other normal pregnancy, antepartum -reassured of fetal well being, has US  in may  -warning signs reviewed       Orders Placed This Encounter  Procedures   POCT Urinalysis Dipstick   Return for Keep next appointment .   Future Appointments  Date Time Provider Department Center  12/29/2023  8:15 AM Slaughterbeck, Sherline Distel, CNM [facility] None  01/07/2024  8:00 AM [facility]    For next visit:  continue with routine prenatal care     Berkley Breech [facility], CNM  12/16/2510:52 PM

## 2023-12-16 NOTE — Assessment & Plan Note (Signed)
-  reassured of fetal well being, has US  in may  -warning signs reviewed

## 2023-12-24 ENCOUNTER — Ambulatory Visit

## 2023-12-29 ENCOUNTER — Ambulatory Visit (INDEPENDENT_AMBULATORY_CARE_PROVIDER_SITE_OTHER): Admitting: Certified Nurse Midwife

## 2023-12-29 ENCOUNTER — Encounter: Payer: Self-pay | Admitting: Certified Nurse Midwife

## 2023-12-29 VITALS — BP 107/72 | HR 78 | Wt 190.9 lb

## 2023-12-29 DIAGNOSIS — Z3A24 24 weeks gestation of pregnancy: Secondary | ICD-10-CM

## 2023-12-29 DIAGNOSIS — Z113 Encounter for screening for infections with a predominantly sexual mode of transmission: Secondary | ICD-10-CM

## 2023-12-29 DIAGNOSIS — Z130 Encounter for screening for diseases of the blood and blood-forming organs and certain disorders involving the immune mechanism: Secondary | ICD-10-CM

## 2023-12-29 DIAGNOSIS — Z3480 Encounter for supervision of other normal pregnancy, unspecified trimester: Secondary | ICD-10-CM

## 2023-12-29 DIAGNOSIS — Z3482 Encounter for supervision of other normal pregnancy, second trimester: Secondary | ICD-10-CM

## 2023-12-29 DIAGNOSIS — O35EXX0 Maternal care for other (suspected) fetal abnormality and damage, fetal genitourinary anomalies, not applicable or unspecified: Secondary | ICD-10-CM | POA: Diagnosis not present

## 2023-12-29 DIAGNOSIS — Z131 Encounter for screening for diabetes mellitus: Secondary | ICD-10-CM

## 2023-12-29 NOTE — Assessment & Plan Note (Signed)
 Feeling more movement now. Patient concerned about weight gain. Reviewed healthy habits and typical weight gain during pregnancy. Reviewed red flag warning signs anticipatory guidance for upcoming prenatal care.

## 2023-12-29 NOTE — Progress Notes (Signed)
    Return Prenatal Note   Assessment/Plan   Plan  33 y.o. G3P1011 at [redacted]w[redacted]d presents for follow-up OB visit. Reviewed prenatal record including previous visit note.  Pyelectasis of fetus on prenatal ultrasound Follow up US  on 5/21 with MFM  Supervision of other normal pregnancy, antepartum Feeling more movement now. Patient concerned about weight gain. Reviewed healthy habits and typical weight gain during pregnancy. Reviewed red flag warning signs anticipatory guidance for upcoming prenatal care.      Orders Placed This Encounter  Procedures   28 Week RH+Panel    Standing Status:   Future    Expected Date:   01/20/2024    Expiration Date:   12/23/2024   No follow-ups on file.   Future Appointments  Date Time Provider Department Center  01/07/2024  8:00 AM [facility]  01/26/2024  8:20 AM [facility] None  01/26/2024  8:55 AM Free, Verita Glassman, CNM [facility] None    For next visit:  4 week ROB with glucola     Subjective   32 y.o. G3P1011 at [redacted]w[redacted]d presents for this follow-up prenatal visit.  Patient some questions about weight gain.  Patient reports: Movement: Present  Objective   Flow sheet Vitals: Pulse Rate: 78 BP: 107/72 Fundal Height: 24 cm Fetal Heart Rate (bpm): 140 Total weight gain: 19 lb 14.4 oz (9.027 kg)  General Appearance  No acute distress, well appearing, and well nourished Pulmonary   Normal work of breathing Neurologic   Alert and oriented to person, place, and time Psychiatric   Mood and affect within normal limits  Donato Fu, CNM  05/12/258:35 AM

## 2023-12-29 NOTE — Assessment & Plan Note (Signed)
 Follow up US  on 5/21 with MFM

## 2023-12-29 NOTE — Progress Notes (Signed)
 ROB. Patient states daily fetal movement, declines pain or pressure. 28 wk labs ordered.  MFM ultrasound 5/21.

## 2024-01-05 ENCOUNTER — Ambulatory Visit

## 2024-01-07 ENCOUNTER — Ambulatory Visit (HOSPITAL_BASED_OUTPATIENT_CLINIC_OR_DEPARTMENT_OTHER): Admitting: Obstetrics

## 2024-01-07 ENCOUNTER — Ambulatory Visit: Payer: Self-pay | Admitting: Maternal & Fetal Medicine

## 2024-01-07 ENCOUNTER — Other Ambulatory Visit: Payer: Self-pay

## 2024-01-07 ENCOUNTER — Ambulatory Visit: Attending: Obstetrics

## 2024-01-07 VITALS — BP 109/64 | HR 92

## 2024-01-07 DIAGNOSIS — Z3A26 26 weeks gestation of pregnancy: Secondary | ICD-10-CM | POA: Insufficient documentation

## 2024-01-07 DIAGNOSIS — O3503X0 Maternal care for (suspected) central nervous system malformation or damage in fetus, choroid plexus cysts, not applicable or unspecified: Secondary | ICD-10-CM

## 2024-01-07 DIAGNOSIS — Z363 Encounter for antenatal screening for malformations: Secondary | ICD-10-CM | POA: Diagnosis not present

## 2024-01-07 DIAGNOSIS — Z3480 Encounter for supervision of other normal pregnancy, unspecified trimester: Secondary | ICD-10-CM

## 2024-01-07 DIAGNOSIS — O358XX0 Maternal care for other (suspected) fetal abnormality and damage, not applicable or unspecified: Secondary | ICD-10-CM

## 2024-01-07 DIAGNOSIS — O35EXX0 Maternal care for other (suspected) fetal abnormality and damage, fetal genitourinary anomalies, not applicable or unspecified: Secondary | ICD-10-CM

## 2024-01-07 NOTE — Progress Notes (Signed)
 MFM Consult Note  Aimee Jackson is currently at 26 weeks and 1 day.  She was seen as a choroid plexus cyst and pyelectasis were noted on Aimee Jackson fetal anatomy scan performed in your office.  She denies any significant past medical history and denies any problems in Aimee Jackson current pregnancy.    She had a cell free DNA test earlier in Aimee Jackson pregnancy which indicated a low risk for trisomy 76, 17, and 13. A female fetus is predicted.   She was informed that the fetal growth and amniotic fluid level were appropriate for Aimee Jackson gestational age.   On today's exam, a right choroid plexus cyst continues to be noted.    The patient was advised that a choroid plexus cyst is most likely a normal variant and will most likely resolve later in Aimee Jackson pregnancy.    The small association between a choroid plexus cyst and trisomy 18 was discussed.    She was reassured that Aimee Jackson fetus's risk of trisomy 52 is low based on Aimee Jackson negative cell free DNA test and as there were no other fetal anomalies noted on today's exam.  Pyelectasis was not noted on today's exam.    Due to the choroid plexus cyst, she was offered and declined an amniocentesis today for definitive diagnosis of fetal chromosomal abnormalities.  The patient was informed that anomalies may be missed due to technical limitations. If the fetus is in a suboptimal position or maternal habitus is increased, visualization of the fetus in the maternal uterus may be impaired.  She should have another ultrasound performed in the third trimester in your office to assess the fetal growth and to ensure that the choroid plexus cyst has resolved.    The patient and Aimee Jackson husband stated that all of their questions were answered today.    No further exams were scheduled in our office.  A total of 30 minutes was spent counseling and coordinating the care for this patient.  Greater than 50% of the time was spent in direct face-to-face contact.

## 2024-01-26 ENCOUNTER — Other Ambulatory Visit

## 2024-01-26 ENCOUNTER — Ambulatory Visit (INDEPENDENT_AMBULATORY_CARE_PROVIDER_SITE_OTHER)

## 2024-01-26 VITALS — BP 103/68 | HR 78 | Wt 195.0 lb

## 2024-01-26 DIAGNOSIS — Z23 Encounter for immunization: Secondary | ICD-10-CM | POA: Diagnosis not present

## 2024-01-26 DIAGNOSIS — Z131 Encounter for screening for diabetes mellitus: Secondary | ICD-10-CM

## 2024-01-26 DIAGNOSIS — Z3A28 28 weeks gestation of pregnancy: Secondary | ICD-10-CM | POA: Diagnosis not present

## 2024-01-26 DIAGNOSIS — Z8659 Personal history of other mental and behavioral disorders: Secondary | ICD-10-CM

## 2024-01-26 DIAGNOSIS — Z130 Encounter for screening for diseases of the blood and blood-forming organs and certain disorders involving the immune mechanism: Secondary | ICD-10-CM

## 2024-01-26 DIAGNOSIS — Z3480 Encounter for supervision of other normal pregnancy, unspecified trimester: Secondary | ICD-10-CM

## 2024-01-26 DIAGNOSIS — Z113 Encounter for screening for infections with a predominantly sexual mode of transmission: Secondary | ICD-10-CM

## 2024-01-26 DIAGNOSIS — Z3483 Encounter for supervision of other normal pregnancy, third trimester: Secondary | ICD-10-CM

## 2024-01-26 DIAGNOSIS — Z3482 Encounter for supervision of other normal pregnancy, second trimester: Secondary | ICD-10-CM

## 2024-01-26 DIAGNOSIS — O35EXX0 Maternal care for other (suspected) fetal abnormality and damage, fetal genitourinary anomalies, not applicable or unspecified: Secondary | ICD-10-CM

## 2024-01-26 NOTE — Assessment & Plan Note (Addendum)
-   28 week labs collected and sent today, anticipatory guidance discussed.  - US  scheduled in 4 weeks to see if Choroid cyst has resolved  - Discussed hip pain and comfort measures such as gentle exercise and stretches, support bands for both the hips and belly, and if desired may see chiropractor for discomfort.  - Reviewed kick counts and preterm labor warning signs. Instructed to call office or come to hospital with persistent headache, vision changes, regular contractions, leaking of fluid, decreased fetal movement or vaginal bleeding.

## 2024-01-26 NOTE — Assessment & Plan Note (Signed)
-   Resolved per MFM on 5/21

## 2024-01-26 NOTE — Progress Notes (Signed)
    Return Prenatal Note   Assessment/Plan   Plan  33 y.o. G3P1011 at [redacted]w[redacted]d presents for follow-up OB visit. Reviewed prenatal record including previous visit note.  Supervision of other normal pregnancy, antepartum - 28 week labs collected and sent today, anticipatory guidance discussed.  - US  scheduled in 4 weeks to see if Choroid cyst has resolved  - Discussed hip pain and comfort measures such as gentle exercise and stretches, support bands for both the hips and belly, and if desired may see chiropractor for discomfort.  - Reviewed kick counts and preterm labor warning signs. Instructed to call office or come to hospital with persistent headache, vision changes, regular contractions, leaking of fluid, decreased fetal movement or vaginal bleeding.      Pyelectasis of fetus on prenatal ultrasound - Resolved per MFM on 5/21   Orders Placed This Encounter  Procedures   US  OB Follow Up    Standing Status:   Future    Expected Date:   02/25/2024    Expiration Date:   04/27/2024    Reason for exam::   choroid plexus cyst follow up    Preferred imaging location?:   Internal   Tdap vaccine greater than or equal to 7yo IM   Return in about 2 weeks (around 02/09/2024) for ROB.   Future Appointments  Date Time Provider Department Center  01/26/2024  8:55 AM Free, Verita Glassman, CNM [facility] None    For next visit:  Routine prenatal care    Subjective  Aimee Jackson is feeling well overall, she has been having increased R hip pain for the past few weeks. She has tried warm compresses and magnesium lotion with some relief.  Movement: Present Contractions: Not present  Objective   Flow sheet Vitals: Pulse Rate: 78 BP: 103/68 Fundal Height: 28 cm Fetal Heart Rate (bpm): 131 Total weight gain: 10.9 kg  General Appearance  No acute distress, well appearing, and well nourished Pulmonary   Normal work of breathing Neurologic   Alert and oriented to person, place, and time Psychiatric   Mood  and affect within normal limits  Aimee Jackson, [facility] 01/26/24 8:49 AM

## 2024-01-27 ENCOUNTER — Ambulatory Visit: Payer: Self-pay | Admitting: Certified Nurse Midwife

## 2024-01-27 LAB — 28 WEEK RH+PANEL
Basophils Absolute: 0 10*3/uL (ref 0.0–0.2)
Basos: 0 %
EOS (ABSOLUTE): 0.2 10*3/uL (ref 0.0–0.4)
Eos: 2 %
Gestational Diabetes Screen: 85 mg/dL (ref 70–139)
HIV Screen 4th Generation wRfx: NONREACTIVE
Hematocrit: 32.7 % — ABNORMAL LOW (ref 34.0–46.6)
Hemoglobin: 10.8 g/dL — ABNORMAL LOW (ref 11.1–15.9)
Immature Grans (Abs): 0 10*3/uL (ref 0.0–0.1)
Immature Granulocytes: 0 %
Lymphocytes Absolute: 2.1 10*3/uL (ref 0.7–3.1)
Lymphs: 19 %
MCH: 30.4 pg (ref 26.6–33.0)
MCHC: 33 g/dL (ref 31.5–35.7)
MCV: 92 fL (ref 79–97)
Monocytes Absolute: 0.7 10*3/uL (ref 0.1–0.9)
Monocytes: 6 %
Neutrophils Absolute: 8.2 10*3/uL — ABNORMAL HIGH (ref 1.4–7.0)
Neutrophils: 73 %
Platelets: 278 10*3/uL (ref 150–450)
RBC: 3.55 x10E6/uL — ABNORMAL LOW (ref 3.77–5.28)
RDW: 12.2 % (ref 11.7–15.4)
RPR Ser Ql: NONREACTIVE
WBC: 11.4 10*3/uL — ABNORMAL HIGH (ref 3.4–10.8)

## 2024-02-09 ENCOUNTER — Encounter: Payer: Self-pay | Admitting: Obstetrics

## 2024-02-09 ENCOUNTER — Ambulatory Visit: Admitting: Obstetrics

## 2024-02-09 VITALS — Wt 197.0 lb

## 2024-02-09 DIAGNOSIS — O35EXX0 Maternal care for other (suspected) fetal abnormality and damage, fetal genitourinary anomalies, not applicable or unspecified: Secondary | ICD-10-CM | POA: Diagnosis not present

## 2024-02-09 DIAGNOSIS — Z3A30 30 weeks gestation of pregnancy: Secondary | ICD-10-CM | POA: Diagnosis not present

## 2024-02-09 DIAGNOSIS — Z8659 Personal history of other mental and behavioral disorders: Secondary | ICD-10-CM

## 2024-02-09 DIAGNOSIS — F411 Generalized anxiety disorder: Secondary | ICD-10-CM

## 2024-02-09 DIAGNOSIS — O3503X0 Maternal care for (suspected) central nervous system malformation or damage in fetus, choroid plexus cysts, not applicable or unspecified: Secondary | ICD-10-CM | POA: Insufficient documentation

## 2024-02-09 DIAGNOSIS — Z3480 Encounter for supervision of other normal pregnancy, unspecified trimester: Secondary | ICD-10-CM

## 2024-02-09 NOTE — Assessment & Plan Note (Signed)
-  Discussed iron supplements and dietary iron -Will bring birth plan to a future visit -Reviewed kick counts and preterm labor warning signs. Instructed to call office or come to hospital with persistent headache, vision changes, regular contractions, leaking of fluid, decreased fetal movement or vaginal bleeding.

## 2024-02-09 NOTE — Progress Notes (Signed)
    Return Prenatal Note   Assessment/Plan   Plan  33 y.o. G3P1011 at [redacted]w[redacted]d presents for follow-up OB visit. Reviewed prenatal record including previous visit note.  Choroid plexus cyst of fetus affecting care of mother, antepartum -F/u US  02/24/24  Supervision of other normal pregnancy, antepartum -Discussed iron supplements and dietary iron -Will bring birth plan to a future visit -Reviewed kick counts and preterm labor warning signs. Instructed to call office or come to hospital with persistent headache, vision changes, regular contractions, leaking of fluid, decreased fetal movement or vaginal bleeding.     No orders of the defined types were placed in this encounter.  Return in about 2 weeks (around 02/23/2024).   Future Appointments  Date Time Provider Department Center  02/24/2024  1:00 PM [facility] US  1 [facility] None  02/24/2024  2:35 PM Dominic, Jinnie Jansky, CNM [facility] None  03/08/2024 10:55 AM Justino Eleanor HERO, CNM [facility] None    For next visit:  Routine prenatal care    Subjective   Aimee Jackson is taking an iron supplement every other day; it is causing some GI distress. She is having some BH ctx. Seeing chiro for hip pain. She would like to know how she will know what labor contractions will feel like.  Movement: Present Contractions: Not present  Objective   Flow sheet Vitals: Fundal Height: 31 cm Fetal Heart Rate (bpm): 147 Total weight gain: 26 lb (11.8 kg)  General Appearance  No acute distress, well appearing, and well nourished Pulmonary   Normal work of breathing Neurologic   Alert and oriented to person, place, and time Psychiatric   Mood and affect within normal limits  Eleanor Justino, CNM 02/09/24 8:45 AM

## 2024-02-09 NOTE — Assessment & Plan Note (Signed)
-  F/u US  02/24/24

## 2024-02-24 ENCOUNTER — Encounter: Payer: Self-pay | Admitting: Licensed Practical Nurse

## 2024-02-24 ENCOUNTER — Ambulatory Visit (INDEPENDENT_AMBULATORY_CARE_PROVIDER_SITE_OTHER): Admitting: Licensed Practical Nurse

## 2024-02-24 ENCOUNTER — Ambulatory Visit

## 2024-02-24 VITALS — BP 115/78 | HR 81 | Wt 204.4 lb

## 2024-02-24 DIAGNOSIS — Z3A33 33 weeks gestation of pregnancy: Secondary | ICD-10-CM

## 2024-02-24 DIAGNOSIS — Z3483 Encounter for supervision of other normal pregnancy, third trimester: Secondary | ICD-10-CM

## 2024-02-24 DIAGNOSIS — Z3480 Encounter for supervision of other normal pregnancy, unspecified trimester: Secondary | ICD-10-CM | POA: Diagnosis not present

## 2024-02-24 NOTE — Progress Notes (Signed)
    Return Prenatal Note   Subjective   33 y.o. G3P1011 at [redacted]w[redacted]d presents for this follow-up prenatal visit.  Patient doing well, no concerns. Here with husband today.  Patient reports: would like to be induced at 40weeks, reviewed we recommend IOL at 41wks for those with a normal pregnancy.  -reviewed birth plan. Vera greatly appreciates informed decision making. She did not like the ripening balloon with her previous induction.  Movement: Present Contractions: Not present  Objective   Flow sheet Vitals: Pulse Rate: 81 BP: 115/78 Fundal Height: 33 cm Fetal Heart Rate (bpm): 140 Total weight gain: 33 lb 6.4 oz (15.2 kg)  General Appearance  No acute distress, well appearing, and well nourished Pulmonary   Normal work of breathing Neurologic   Alert and oriented to person, place, and time Psychiatric   Mood and affect within normal limits  Assessment/Plan   Plan  33 y.o. G3P1011 at [redacted]w[redacted]d presents for follow-up OB visit. Reviewed prenatal record including previous visit note.  Supervision of other normal pregnancy, antepartum - doing well. +FM, denies contractions  - Reports some HA, usually goes away with tylenol and/or rest and water. Discussed PreE signs. Denies vision changes and RUQ pain.  - Declines use of ASA.  - Reports mood is stable since recently increase to Zoloft dose. Denies depression. - Has a breastpump, has a birth plan.  - Reviewed PTL signs, warning signs, and fetal kick count reviewed.  -US  today shows Unable to visualize choroid plexus well due to AGA, but what was seen no cyst was noted.  - R/O in 2 weeks. GBS at 36 weeks.       No orders of the defined types were placed in this encounter.  Return in about 1 week (around 03/02/2024) for ROB.   Future Appointments  Date Time Provider Department Center  03/08/2024 10:55 AM Justino Eleanor HERO, CNM [facility] None    For next visit:  continue with routine prenatal care GBS at 36weeks.      LYDIA M DOMINIC, CNM  07/08/255:34 PM

## 2024-02-24 NOTE — Assessment & Plan Note (Addendum)
-   doing well. +FM, denies contractions  - Reports some HA, usually goes away with tylenol and/or rest and water. Discussed PreE signs. Denies vision changes and RUQ pain.  - Declines use of ASA.  - Reports mood is stable since recently increase to Zoloft dose. Denies depression. - Has a breastpump, has a birth plan.  - Reviewed PTL signs, warning signs, and fetal kick count reviewed.  -US  today shows Unable to visualize choroid plexus well due to AGA, but what was seen no cyst was noted.  - R/O in 2 weeks. GBS at 36 weeks.

## 2024-03-08 ENCOUNTER — Ambulatory Visit (INDEPENDENT_AMBULATORY_CARE_PROVIDER_SITE_OTHER): Admitting: Obstetrics

## 2024-03-08 ENCOUNTER — Encounter: Payer: Self-pay | Admitting: Obstetrics

## 2024-03-08 VITALS — BP 108/72 | HR 89 | Wt 205.0 lb

## 2024-03-08 DIAGNOSIS — Z3480 Encounter for supervision of other normal pregnancy, unspecified trimester: Secondary | ICD-10-CM | POA: Diagnosis not present

## 2024-03-08 DIAGNOSIS — Z8659 Personal history of other mental and behavioral disorders: Secondary | ICD-10-CM | POA: Diagnosis not present

## 2024-03-08 NOTE — Assessment & Plan Note (Signed)
 Reviewed kick counts and preterm labor warning signs. Instructed to call office or come to hospital with persistent headache, vision changes, regular contractions, leaking of fluid, decreased fetal movement or vaginal bleeding.

## 2024-03-08 NOTE — Assessment & Plan Note (Signed)
-   Cyst was not visualized on last US , MFM did not recommend to follow up with them again.

## 2024-03-08 NOTE — Assessment & Plan Note (Signed)
-   Feeling good on her Zoloft.

## 2024-03-08 NOTE — Progress Notes (Addendum)
    Return Prenatal Note   Assessment/Plan   Plan  33 y.o. G3P1011 at [redacted]w[redacted]d presents for follow-up OB visit. Reviewed prenatal record including previous visit note.  Supervision of other normal pregnancy, antepartum - Reviewed kick counts and preterm labor warning signs. Instructed to call office or come to hospital with persistent headache, vision changes, regular contractions, leaking of fluid, decreased fetal movement or vaginal bleeding.    Choroid plexus cyst of fetus affecting care of mother, antepartum - Cyst was not visualized on last US , MFM did not recommend to follow up with them again.   History of depression - Feeling good on her Zoloft.     No orders of the defined types were placed in this encounter.  No follow-ups on file.   Future Appointments  Date Time Provider Department Center  03/18/2024  9:35 AM Justino Eleanor HERO, CNM [facility] None    For next visit:  Routine prenatal care    Subjective  Feeling well overall, she is just increasingly tired. Has had some increased round ligament pain especially at night.   Movement: Present Contractions: Not present  Objective   Flow sheet Vitals: Pulse Rate: 89 BP: 108/72 Fundal Height: 34 cm Fetal Heart Rate (bpm): 148 Total weight gain: 34 lb (15.4 kg)  General Appearance  No acute distress, well appearing, and well nourished Pulmonary   Normal work of breathing Neurologic   Alert and oriented to person, place, and time Psychiatric   Mood and affect within normal limits  Rosina Hamilton, [facility] 03/08/24 11:25 AM

## 2024-03-16 ENCOUNTER — Other Ambulatory Visit: Payer: Self-pay

## 2024-03-16 ENCOUNTER — Encounter: Payer: Self-pay | Admitting: Obstetrics and Gynecology

## 2024-03-16 ENCOUNTER — Observation Stay
Admission: EM | Admit: 2024-03-16 | Discharge: 2024-03-16 | Disposition: A | Discharge status: Home / Self Care | Source: Ambulatory Visit | Attending: Obstetrics and Gynecology | Admitting: Obstetrics and Gynecology

## 2024-03-16 ENCOUNTER — Telehealth: Payer: Self-pay

## 2024-03-16 DIAGNOSIS — Z3A36 36 weeks gestation of pregnancy: Secondary | ICD-10-CM | POA: Diagnosis not present

## 2024-03-16 DIAGNOSIS — R109 Unspecified abdominal pain: Secondary | ICD-10-CM | POA: Diagnosis not present

## 2024-03-16 DIAGNOSIS — O26893 Other specified pregnancy related conditions, third trimester: Secondary | ICD-10-CM | POA: Diagnosis present

## 2024-03-16 DIAGNOSIS — O35EXX0 Maternal care for other (suspected) fetal abnormality and damage, fetal genitourinary anomalies, not applicable or unspecified: Secondary | ICD-10-CM

## 2024-03-16 DIAGNOSIS — R102 Pelvic and perineal pain: Secondary | ICD-10-CM | POA: Insufficient documentation

## 2024-03-16 DIAGNOSIS — O479 False labor, unspecified: Secondary | ICD-10-CM | POA: Diagnosis present

## 2024-03-16 DIAGNOSIS — R1010 Upper abdominal pain, unspecified: Secondary | ICD-10-CM | POA: Diagnosis not present

## 2024-03-16 DIAGNOSIS — Z3480 Encounter for supervision of other normal pregnancy, unspecified trimester: Principal | ICD-10-CM

## 2024-03-16 NOTE — Telephone Encounter (Signed)
 Patient contacted office triage line with concerns of sharp vaginal pain/pressure and hardening of her belly that has been persistent for 56 min straight. Patient stated while on phone that she did feel her baby move  a little while ago. Patient denied back pain, nausea/vomiting, visual changes, vaginal bleeding or leaking of fluid or shortness of breath. Patient stated that she has eaten, drank and has tried changing positions and walking around house but stomach reminds hard and tight. Advised patient to go to ED for monitoring and evaluation since there is no availability this afternoon in clinic. KW

## 2024-03-16 NOTE — Final Consult Note (Signed)
 OB/Triage Note  Patient ID: Aimee Jackson MRN: [id number] DOB/AGE: 10/10/1990 33 y.o.  Subjective  History of Present Illness: The patient is a 33 y.o. female G3P1011 at [redacted]w[redacted]d who presents for feeling abdomen tightening and pelvic pressure. The tightening is across her upper abdomen, but feels more constant rather then intermittent. Pelvic pressure also feels somewhat constant. Denies painful contractions, LOF, VB, visual changes or HA. Reports good FM.   Past Medical History:  Diagnosis Date   Anxiety     Past Surgical History:  Procedure Laterality Date   TONSILECTOMY, ADENOIDECTOMY, BILATERAL MYRINGOTOMY AND TUBES      No current facility-administered medications on file prior to encounter.   Current Outpatient Medications on File Prior to Encounter  Medication Sig Dispense Refill   ferrous sulfate 324 MG TBEC Take 20 mg by mouth.     Prenatal Vit-Fe Fumarate-FA (MULTIVITAMIN-PRENATAL) 27-0.8 MG TABS tablet Take 1 tablet by mouth daily at 12 noon.     sertraline (ZOLOFT) 50 MG tablet Take 50 mg by mouth daily.      Allergies  Allergen Reactions   Aleve [Naproxen] Anaphylaxis   Naproxen Sodium Anaphylaxis and Swelling   Codeine Nausea And Vomiting    Social History   Socioeconomic History   Marital status: Married    Spouse name: Not on file   Number of children: Not on file   Years of education: Not on file   Highest education level: Not on file  Occupational History   Not on file  Tobacco Use   Smoking status: Never   Smokeless tobacco: Never  Vaping Use   Vaping status: Former  Substance and Sexual Activity   Alcohol use: Not Currently    Comment: occasionally   Drug use: Never   Sexual activity: Yes    Birth control/protection: None  Other Topics Concern   Not on file  Social History Narrative   Not on file   Social Drivers of Health   Financial Resource Strain: Low Risk  (03/04/2023)   Received from [facility]   Overall  Financial Resource Strain (CARDIA)    Difficulty of Paying Living Expenses: Not hard at all  Food Insecurity: No Food Insecurity (03/04/2023)   Received from [facility]   Hunger Vital Sign    Within the past 12 months, you worried that your food would run out before you got the money to buy more.: Never true    Within the past 12 months, the food you bought just didn't last and you didn't have money to get more.: Never true  Transportation Needs: No Transportation Needs (03/04/2023)   Received from [facility] - Transportation    In the past 12 months, has lack of transportation kept you from medical appointments or from getting medications?: No    Lack of Transportation (Non-Medical): No  Physical Activity: Insufficiently Active (02/09/2019)   Received from [facility]   Exercise Vital Sign    On average, how many days per week do you engage in moderate to strenuous exercise (like a brisk walk)?: 3 days    On average, how many minutes do you engage in exercise at this level?: 30 min  Stress: No Stress Concern Present (02/09/2019)   Received from [facility] - Occupational Stress Questionnaire    Feeling of Stress : Only a little  Social Connections: Moderately Integrated (09/12/2023)   Social Connection  and Isolation Panel    Frequency of Communication with Friends and Family: Three times a week    Frequency of Social Gatherings with Friends and Family: Once a week    Attends Religious Services: 1 to 4 times per year    Active Member of [employer] or Organizations: No    Attends Engineer, structural: Not on file    Marital Status: Married  Catering manager Violence: Not At Risk (09/12/2023)   Humiliation, Afraid, Rape, and Kick questionnaire    Fear of Current or Ex-Partner: No    Emotionally Abused: No    Physically Abused: No    Sexually Abused: No    Family History  Problem  Relation Age of Onset   Healthy Mother      ROS    Objective  Physical Exam: BP 129/70   Pulse 85   Temp 98.4 F (36.9 C) (Oral)   Resp 15   Ht 5' 5 (1.651 m)   Wt 93 kg   LMP 07/08/2023 (Exact Date)   BMI 34.11 kg/m   OBGyn Exam  FHT 135, mod variability, pos accels, no decels Toco some irritability, improved with hydration    Hospital Course: The patient was admitted to [facility] Triage for observation. Monitored Zarayah for one hour. Had RNST and Cat I strip the entire time. Encouraged oral hydration. Uterine irritability improved. Patient stated her abdominal pain and pelvic pressure were both feeling better and she felt ready to go home.  Assessment: 33 y.o. female G3P1011 at [redacted]w[redacted]d  RNST Not in labor  Plan: Discharge home Follow up at next ROB or sooner as needed Teaching: s/sx of labor  Discharge Instructions     Discharge activity:  No Restrictions   Complete by: As directed    Discharge diet:  No restrictions   Complete by: As directed    Discharge instructions   Complete by: As directed    Follow up with your OB provider at your next ROB   LABOR:  When conractions begin, you should start to time them from the beginning of one contraction to the beginning  of the next.  When contractions are 5 - 10 minutes apart or less and have been regular for at least an hour, you should call your health care provider.   Complete by: As directed    No sexual activity restrictions   Complete by: As directed    Notify physician for a general feeling that something is not right   Complete by: As directed    Notify physician for bleeding from the vagina   Complete by: As directed    Notify physician for blurring of vision or spots before the eyes   Complete by: As directed    Notify physician for chills or fever   Complete by: As directed    Notify physician for fainting spells, black outs or loss of consciousness   Complete by: As directed    Notify physician for  increase in vaginal discharge   Complete by: As directed    Notify physician for increase or change in vaginal discharge   Complete by: As directed    Notify physician for intestinal cramps, with or without diarrhea, sometimes described as gas pain   Complete by: As directed    Notify physician for leaking of fluid   Complete by: As directed    Notify physician for leaking of fluid   Complete by: As directed    Notify physician for low, dull  backache, unrelieved by heat or Tylenol   Complete by: As directed    Notify physician for menstrual like cramps   Complete by: As directed    Notify physician for pain or burning when urinating   Complete by: As directed    Notify physician for pelvic pressure   Complete by: As directed    Notify physician for pelvic pressure (sudden increase)   Complete by: As directed    Notify physician for severe or continued nausea or vomiting   Complete by: As directed    Notify physician for sudden gushing of fluid from the vagina (with or without continued leaking)   Complete by: As directed    Notify physician for sudden, constant, or occasional abdominal pain   Complete by: As directed    Notify physician for uterine contractions.  These may be painless and feel like the uterus is tightening or the baby is  balling up   Complete by: As directed    Notify physician for vaginal bleeding   Complete by: As directed    Notify physician if baby moving less than usual   Complete by: As directed    PRETERM LABOR:  Includes any of the follwing symptoms that occur between 20 - [redacted] weeks gestation.  If these symptoms are not stopped, preterm labor can result in preterm delivery, placing your baby at risk   Complete by: As directed       Allergies as of 03/16/2024       Reactions   Aleve [naproxen] Anaphylaxis   Naproxen Sodium Anaphylaxis, Swelling   Codeine Nausea And Vomiting        Medication List     TAKE these medications    ferrous  sulfate 324 MG Tbec Take 20 mg by mouth.   multivitamin-prenatal 27-0.8 MG Tabs tablet Take 1 tablet by mouth daily at 12 noon.   sertraline 50 MG tablet Commonly known as: ZOLOFT Take 50 mg by mouth daily.         Total time spent taking care of this patient: 60 minutes  Signed: Lolita Loots CNM, FNP 03/16/2024, 6:28 PM

## 2024-03-16 NOTE — OB Triage Note (Signed)
Discharge instructions, labor precautions, and follow-up care reviewed with patient. All questions answered. Patient verbalized understanding. Discharged ambulatory off unit.   

## 2024-03-16 NOTE — OB Triage Note (Signed)
 Patient is a G3P1011 at [redacted]w[redacted]d who presents to unit c/o abdominal tightness since around 1500 today. Reports a constant abdominal tightness with occasional vaginal pain and pressure that has sudsided somewhat but is still present. Reports +fetal movement, denies vaginal bleeding and leakage of fluid. External monitors applied and assessing. Initial FHT 135. Vital signs WDL.

## 2024-03-18 ENCOUNTER — Encounter: Payer: Self-pay | Admitting: Obstetrics

## 2024-03-18 ENCOUNTER — Ambulatory Visit (INDEPENDENT_AMBULATORY_CARE_PROVIDER_SITE_OTHER): Admitting: Obstetrics

## 2024-03-18 ENCOUNTER — Other Ambulatory Visit (HOSPITAL_COMMUNITY)
Admission: RE | Admit: 2024-03-18 | Discharge: 2024-03-18 | Disposition: A | Discharge status: Home / Self Care | Source: Ambulatory Visit | Attending: Obstetrics | Admitting: Obstetrics

## 2024-03-18 VITALS — BP 111/73 | HR 73 | Wt 207.6 lb

## 2024-03-18 DIAGNOSIS — Z113 Encounter for screening for infections with a predominantly sexual mode of transmission: Secondary | ICD-10-CM

## 2024-03-18 DIAGNOSIS — Z3480 Encounter for supervision of other normal pregnancy, unspecified trimester: Secondary | ICD-10-CM

## 2024-03-18 DIAGNOSIS — Z3685 Encounter for antenatal screening for Streptococcus B: Secondary | ICD-10-CM | POA: Diagnosis present

## 2024-03-18 LAB — CERVICOVAGINAL ANCILLARY ONLY
Chlamydia: NEGATIVE
Comment: NEGATIVE
Comment: NORMAL
Neisseria Gonorrhea: NEGATIVE

## 2024-03-18 NOTE — Assessment & Plan Note (Addendum)
-  GBS and GC/CT swabs collected today -Cephalic by [facility] -Encouraging Timely Labor handout sent via MyChart -Discussed common discomforts of late pregnancy -Reviewed labor warning signs. Instructed to call office or come to hospital with persistent headache, vision changes, regular contractions, leaking of fluid, decreased fetal movement or vaginal bleeding.

## 2024-03-18 NOTE — Progress Notes (Signed)
    Return Prenatal Note   Assessment/Plan   Plan  33 y.o. G3P1011 at [redacted]w[redacted]d presents for follow-up OB visit. Reviewed prenatal record including previous visit note.  Supervision of other normal pregnancy, antepartum -GBS and GC/CT swabs collected today -Cephalic by [facility] -Encouraging Timely Labor handout sent via MyChart -Discussed common discomforts of late pregnancy -Reviewed labor warning signs. Instructed to call office or come to hospital with persistent headache, vision changes, regular contractions, leaking of fluid, decreased fetal movement or vaginal bleeding.     Orders Placed This Encounter  Procedures   Strep Gp B NAA   Return in about 1 week (around 03/25/2024).   No future appointments.  For next visit:  Routine prenatal care    Subjective   Beyounce has been having increased cramping and tightening. She is ready to meet baby.  Movement: Present Contractions: Irritability  Objective   Flow sheet Vitals: Pulse Rate: 73 BP: 111/73 Fundal Height: 36 cm Fetal Heart Rate (bpm): 136 Presentation: Vertex Total weight gain: 36 lb 9.6 oz (16.6 kg)  General Appearance  No acute distress, well appearing, and well nourished Pulmonary   Normal work of breathing Neurologic   Alert and oriented to person, place, and time Psychiatric   Mood and affect within normal limits  Eleanor Canny, CNM 03/18/24 10:02 AM

## 2024-03-20 LAB — STREP GP B NAA: Strep Gp B NAA: NEGATIVE

## 2024-03-22 ENCOUNTER — Ambulatory Visit: Admitting: Obstetrics & Gynecology

## 2024-03-22 VITALS — BP 110/62 | HR 85 | Wt 211.0 lb

## 2024-03-22 DIAGNOSIS — Z3403 Encounter for supervision of normal first pregnancy, third trimester: Secondary | ICD-10-CM

## 2024-03-22 DIAGNOSIS — O99210 Obesity complicating pregnancy, unspecified trimester: Secondary | ICD-10-CM | POA: Insufficient documentation

## 2024-03-22 DIAGNOSIS — Z3480 Encounter for supervision of other normal pregnancy, unspecified trimester: Secondary | ICD-10-CM

## 2024-03-22 DIAGNOSIS — Z3A36 36 weeks gestation of pregnancy: Secondary | ICD-10-CM

## 2024-03-22 DIAGNOSIS — Z3A37 37 weeks gestation of pregnancy: Secondary | ICD-10-CM

## 2024-03-22 NOTE — Progress Notes (Signed)
   PRENATAL VISIT NOTE  Subjective:  Aimee Jackson is a 33 y.o. G3P1011 at [redacted]w[redacted]d being seen today for ongoing prenatal care.  She is currently monitored for the following issues for this low-risk pregnancy and has Supervision of other normal pregnancy, antepartum; History of depression; Anxiety, generalized; Pyelectasis of fetus on prenatal ultrasound; Choroid plexus cyst of fetus affecting care of mother, antepartum; Uterine contractions; and Obesity in pregnancy on their problem list.  Patient reports no complaints.  Contractions: Irritability. Vag. Bleeding: None.  Movement: Present. Denies leaking of fluid.   The following portions of the patient's history were reviewed and updated as appropriate: allergies, current medications, past family history, past medical history, past social history, past surgical history and problem list.   Objective:    Vitals:   03/22/24 1451  BP: 110/62  Pulse: 85  Weight: 211 lb (95.7 kg)    Fetal Status:  Fetal Heart Rate (bpm): 130 Fundal Height: 37 cm Movement: Present Presentation: Vertex  General: Alert, oriented and cooperative. Patient is in no acute distress.  Skin: Skin is warm and dry. No rash noted.   Cardiovascular: Normal heart rate noted  Respiratory: Normal respiratory effort, no problems with respiration noted  Abdomen: Soft, gravid, appropriate for gestational age.  Pain/Pressure: Present     Pelvic: Cervical exam performed in the presence of a chaperone Dilation: Closed Effacement (%): 50 Station: Ballotable  Extremities: Normal range of motion.     Mental Status: Normal mood and affect. Normal behavior. Normal judgment and thought content.   Assessment and Plan:  Pregnancy: G3P1011 at [redacted]w[redacted]d 1. Supervision of other normal pregnancy, antepartum (Primary) - Labor precautions reviewed  2. [redacted] weeks gestation of pregnancy   Preterm labor symptoms and general obstetric precautions including but not limited to vaginal bleeding,  contractions, leaking of fluid and fetal movement were reviewed in detail with the patient. Please refer to After Visit Summary for other counseling recommendations.   Return in about 1 week (around 03/29/2024).  Future Appointments  Date Time Provider Department Center  03/30/2024  4:15 PM Starla Harland BROCKS, MD [facility] None    Harland BROCKS Starla, MD

## 2024-03-26 ENCOUNTER — Ambulatory Visit

## 2024-03-26 ENCOUNTER — Encounter: Payer: Self-pay | Admitting: Obstetrics and Gynecology

## 2024-03-26 ENCOUNTER — Observation Stay
Admission: EM | Admit: 2024-03-26 | Discharge: 2024-03-26 | Disposition: A | Discharge status: Home / Self Care | Attending: Certified Nurse Midwife | Admitting: Certified Nurse Midwife

## 2024-03-26 ENCOUNTER — Other Ambulatory Visit: Payer: Self-pay

## 2024-03-26 VITALS — BP 123/80 | HR 114 | Ht 65.0 in | Wt 212.3 lb

## 2024-03-26 DIAGNOSIS — O35EXX0 Maternal care for other (suspected) fetal abnormality and damage, fetal genitourinary anomalies, not applicable or unspecified: Secondary | ICD-10-CM

## 2024-03-26 DIAGNOSIS — Z3480 Encounter for supervision of other normal pregnancy, unspecified trimester: Principal | ICD-10-CM

## 2024-03-26 DIAGNOSIS — O368330 Maternal care for abnormalities of the fetal heart rate or rhythm, third trimester, not applicable or unspecified: Secondary | ICD-10-CM | POA: Diagnosis not present

## 2024-03-26 DIAGNOSIS — O26893 Other specified pregnancy related conditions, third trimester: Secondary | ICD-10-CM | POA: Diagnosis present

## 2024-03-26 DIAGNOSIS — Z3A37 37 weeks gestation of pregnancy: Secondary | ICD-10-CM | POA: Insufficient documentation

## 2024-03-26 DIAGNOSIS — R42 Dizziness and giddiness: Secondary | ICD-10-CM | POA: Insufficient documentation

## 2024-03-26 DIAGNOSIS — O99891 Other specified diseases and conditions complicating pregnancy: Secondary | ICD-10-CM | POA: Diagnosis not present

## 2024-03-26 DIAGNOSIS — R0602 Shortness of breath: Secondary | ICD-10-CM

## 2024-03-26 DIAGNOSIS — Z3483 Encounter for supervision of other normal pregnancy, third trimester: Secondary | ICD-10-CM

## 2024-03-26 NOTE — OB Triage Provider Note (Addendum)
   L&D OB Triage Note  SUBJECTIVE Aimee Jackson is a 33 y.o. G45P1011 female at [redacted]w[redacted]d, EDD Estimated Date of Delivery: 04/13/24 who presented to triage with complaints of  having an episode of feeling hot and dizzy this morning. She denies loss of fluid, vaginal bleeding and contraction. She has history of anxiety and panic attacks. Last occurrence was in first trimester.   OB History  Gravida Para Term Preterm AB Living  3 1 1  0 1 1  SAB IAB Ectopic Multiple Live Births  1 0 0 0 1    # Outcome Date GA Lbr Len/2nd Weight Sex Type Anes PTL Lv  3 Current           2 Term 02/10/19 [redacted]w[redacted]d  2750 g M Vag-Spont EPI  LIV  1 SAB 2019            Medications Prior to Admission  Medication Sig Dispense Refill Last Dose/Taking   ferrous sulfate 324 MG TBEC Take 20 mg by mouth.      Prenatal Vit-Fe Fumarate-FA (MULTIVITAMIN-PRENATAL) 27-0.8 MG TABS tablet Take 1 tablet by mouth daily at 12 noon.      sertraline (ZOLOFT) 50 MG tablet Take 50 mg by mouth daily.        OBJECTIVE  Nursing Evaluation:   BP 115/71 (BP Location: Left Arm)   Pulse 77   Temp 97.7 F (36.5 C) (Oral)   Resp 18   Ht 5' 5 (1.651 m)   Wt 96.2 kg   LMP 07/08/2023 (Exact Date)   BMI 35.28 kg/m    Findings: panic attack           NST was performed and has been reviewed by me.  NST INTERPRETATION: Category I  Mode: External Baseline Rate (A): 135 bpm Variability: Moderate Accelerations: 15 x 15 Decelerations: None     Contraction Frequency (min): mild UI   ASSESSMENT Impression:  1.  Pregnancy:  G3P1011 at [redacted]w[redacted]d , EDD Estimated Date of Delivery: 04/13/24 2.  Reassuring fetal and maternal status 3.  Reassurance given , she is feeling better now and has medications at home to take for her panic attacks. Offered medication here but she is driving and not recommend to use with driving.   PLAN 1. Current condition and above findings reviewed.  Reassuring fetal and maternal condition. She is currently seeing  consoler every other week. Discussed weekly visit until delivery. She is in agreement.   2. Discharge home with standard labor precautions given to return to L&D or call the office for problems. 3. Continue routine prenatal care. Next scheduled appointment Tuesday.   I was present and evaluated this patient in person.   [name], CNM

## 2024-03-26 NOTE — Progress Notes (Signed)
 Patient walked into office today with concerns of shortness of breath, elevated heart rate, feeling flushed and reports cramping. Patient reports fetal movement, denies pain and pressure but reports lower abdominal cramping, denies any signs of bleeding or discharge or edema.   Vitals were reviewed with Slater Rains, and it was determined that patient should go to L&D for triage and monitoring since patient did reports that she has only felt baby move once this morning. Patient verbalized understanding and is heading to L&D for evaluation.  Vitals:   03/26/24 1058  BP: 123/80  Pulse: (!) 114  SpO2: 99%    Rollo.W, NCMA

## 2024-03-26 NOTE — Patient Instructions (Signed)
 Third Trimester of Pregnancy  The third trimester of pregnancy is from week 28 through week 40. This is months 7 through 9. The third trimester is a time when your baby is growing fast. Body changes during your third trimester Your body continues to change during this time. The changes usually go away after your baby is born. Physical changes You will continue to gain weight. You may get stretch marks on your hips, belly, and breasts. Your breasts will keep growing and may hurt. A yellow fluid (colostrum) may leak from your breasts. This is the first milk you're making for your baby. Your hair may grow faster and get thicker. In some cases, you may get hair loss. Your belly button may stick out. You may have more swelling in your hands, face, or ankles. Health changes You may have heartburn. You may feel short of breath. This is caused by the uterus that is now bigger. You may have more aches in the pelvis, back, or thighs. You may have more tingling or numbness in your hands, arms, and legs. You may pee more often. You may have trouble pooping (constipation) or swollen veins in the butt that can itch or get painful (hemorrhoids). Other changes You may have more problems sleeping. You may notice the baby moving lower in your belly (dropping). You may have more fluid coming from your vagina. Your joints may feel loose, and you may have pain around your pelvic bone. Follow these instructions at home: Medicines Take medicines only as told by your health care provider. Some medicines are not safe during pregnancy. Your provider may change the medicines that you take. Do not take any medicines unless told to by your provider. Take a prenatal vitamin that has at least 600 micrograms (mcg) of folic acid. Do not use herbal medicines, illegal drugs, or medicines that are not approved by your provider. Eating and drinking While you're pregnant your body needs additional nutrition to help  support your growing baby. Talk with your provider about your nutritional needs. Activity Most women are able to exercise regularly during pregnancy. Exercise routines may need to change at the end of your pregnancy. Talk to your provider about your activities and exercise routine. Relieving pain and discomfort Rest often with your legs raised if you have leg cramps or low back pain. Take warm sitz baths to soothe pain from hemorrhoids. Use hemorrhoid cream if your provider says it's okay. Wear a good, supportive bra if your breasts hurt. Do not use hot tubs, steam rooms, or saunas. Do not douche. Do not use tampons or scented pads. Safety Talk to your provider before traveling far distances. Wear your seatbelt at all times when you're in a car. Talk to your provider if someone hits you, hurts you, or yells at you. Preparing for birth To prepare for your baby: Take childbirth and breastfeeding classes. Visit the hospital and tour the maternity area. Buy a rear-facing car seat. Learn how to install it in your car. General instructions Avoid cat litter boxes and soil used by cats. These things carry germs that can cause harm to your pregnancy and your baby. Do not drink alcohol, smoke, vape, or use products with nicotine or tobacco in them. If you need help quitting, talk with your provider. Keep all follow-up visits for your third trimester. Your provider will do more exams and tests during this trimester. Write down your questions. Take them to your prenatal visits. Your provider also will: Talk with you about  your overall health. Give you advice or refer you to specialists who can help with different needs, including: Mental health and counseling. Foods and healthy eating. Ask for help if you need help with food. Where to find more information American Pregnancy Association: americanpregnancy.org [employer] of [employer]stetricians and Gynecologists: acog.org Office on [employer] Health:  [employer] Contact a health care provider if: You have a headache that does not go away when you take medicine. You have any of these problems: You can't eat or drink. You have nausea and vomiting. You have watery poop (diarrhea) for 2 days or more. You have pain when you pee, or your pee smells bad. You have been sick for 2 days or more and aren't getting better. Contact your provider right away if: You have any of these coming from your vagina: Abnormal discharge. Bad-smelling fluid. Bleeding. Your baby is moving less than usual. You have signs of labor: You have any contractions, belly cramping, or have pain in your pelvis or lower back before 37 weeks of pregnancy (preterm labor). You have regular contractions that are less than 5 minutes apart. Your water breaks. You have symptoms of high blood pressure or preeclampsia. These include: A severe, throbbing headache that does not go away. Sudden or extreme swelling of your face, hands, legs, or feet. Vision problems: You see spots. You have blurry vision. Your eyes are sensitive to light. If you can't reach your provider, go to an urgent care or emergency room. Get help right away if: You faint, become confused, or can't think clearly. You have chest pain or trouble breathing. You have any kind of injury, such as from a fall or a car crash. These symptoms may be an emergency. Call 911 right away. Do not wait to see if the symptoms will go away. Do not drive yourself to the hospital. This information is not intended to replace advice given to you by your health care provider. Make sure you discuss any questions you have with your health care provider. Document Revised: 05/08/2023 Document Reviewed: 12/06/2022 Elsevier Patient Education  2024 [employer].

## 2024-03-26 NOTE — OB Triage Note (Signed)

## 2024-03-26 NOTE — OB Triage Note (Signed)
 Pt reports to labor and delivery with complaints of just feeling off. She woke up this morning feeling dizzy and very hot. She has not taken any medications today. Denies losing consciousness.   No changes in activity or eating habits.   Pt states positive fetal movement. Denies LOF, vaginal bleeding, and contractions.    Had an anxiety attack at 2-3 months pregnant.  EFM and toco applied and assessing.

## 2024-03-30 ENCOUNTER — Ambulatory Visit: Admitting: Obstetrics & Gynecology

## 2024-03-30 VITALS — BP 117/68 | HR 93 | Wt 214.0 lb

## 2024-03-30 DIAGNOSIS — O99213 Obesity complicating pregnancy, third trimester: Secondary | ICD-10-CM

## 2024-03-30 DIAGNOSIS — Z3A38 38 weeks gestation of pregnancy: Secondary | ICD-10-CM | POA: Diagnosis not present

## 2024-03-30 DIAGNOSIS — E669 Obesity, unspecified: Secondary | ICD-10-CM | POA: Diagnosis not present

## 2024-03-30 DIAGNOSIS — Z3480 Encounter for supervision of other normal pregnancy, unspecified trimester: Secondary | ICD-10-CM

## 2024-03-30 DIAGNOSIS — O99210 Obesity complicating pregnancy, unspecified trimester: Secondary | ICD-10-CM

## 2024-03-30 NOTE — Progress Notes (Signed)
   PRENATAL VISIT NOTE  Subjective:  Aimee Jackson is a 33 y.o. G3P1011 at [redacted]w[redacted]d being seen today for ongoing prenatal care.  She is currently monitored for the following issues for this low-risk pregnancy and has Supervision of other normal pregnancy, antepartum; History of depression; Anxiety, generalized; Pyelectasis of fetus on prenatal ultrasound; Choroid plexus cyst of fetus affecting care of mother, antepartum; Uterine contractions; Obesity in pregnancy; and Labor and delivery, indication for care on their problem list.  Patient reports no complaints.  Contractions: Irritability. Vag. Bleeding: None.  Movement: Present. Denies leaking of fluid.   Her son starts kindergarten on 04-12-2024 and she would like to be home, already delivered by then. She would like IOL. She had a IOL with her first baby, unfavorable cervix and still had a quick delivery.  The following portions of the patient's history were reviewed and updated as appropriate: allergies, current medications, past family history, past medical history, past social history, past surgical history and problem list.   Objective:    Vitals:   03/30/24 1306  BP: 117/68  Pulse: 93  Weight: 214 lb (97.1 kg)    Fetal Status:  Fetal Heart Rate (bpm): 142 Fundal Height: 37 cm Movement: Present Presentation: Vertex  General: Alert, oriented and cooperative. Patient is in no acute distress.  Skin: Skin is warm and dry. No rash noted.   Cardiovascular: Normal heart rate noted  Respiratory: Normal respiratory effort, no problems with respiration noted  Abdomen: Soft, gravid, appropriate for gestational age.  Pain/Pressure: Present     Pelvic: Cervical exam performed in the presence of a chaperone Dilation: 1 Effacement (%): Thick Station: Ballotable  Extremities: Normal range of motion.     Mental Status: Normal mood and affect. Normal behavior. Normal judgment and thought content.   Assessment and Plan:  Pregnancy: G3P1011 at  [redacted]w[redacted]d 1. Supervision of other normal pregnancy, antepartum (Primary)   2. Obesity in pregnancy   3. [redacted] weeks gestation of pregnancy   Term labor symptoms and general obstetric precautions including but not limited to vaginal bleeding, contractions, leaking of fluid and fetal movement were reviewed in detail with the patient. Please refer to After Visit Summary for other counseling recommendations.   Return in about 1 week (around 04/06/2024).  Future Appointments  Date Time Provider Department Center  03/30/2024  4:15 PM Starla Harland BROCKS, MD [facility] None    Harland BROCKS Starla, MD

## 2024-04-05 ENCOUNTER — Ambulatory Visit (INDEPENDENT_AMBULATORY_CARE_PROVIDER_SITE_OTHER): Admitting: Obstetrics & Gynecology

## 2024-04-05 VITALS — BP 117/70 | HR 83 | Wt 219.2 lb

## 2024-04-05 DIAGNOSIS — Z3480 Encounter for supervision of other normal pregnancy, unspecified trimester: Secondary | ICD-10-CM

## 2024-04-05 DIAGNOSIS — Z3A39 39 weeks gestation of pregnancy: Secondary | ICD-10-CM

## 2024-04-05 NOTE — Progress Notes (Signed)
   PRENATAL VISIT NOTE  Subjective:  Aimee Jackson is a 33 y.o. G3P1011 at [redacted]w[redacted]d being seen today for ongoing prenatal care.  She is currently monitored for the following issues for this low-risk pregnancy and has Supervision of other normal pregnancy, antepartum; History of depression; Anxiety, generalized; Pyelectasis of fetus on prenatal ultrasound; Choroid plexus cyst of fetus affecting care of mother, antepartum; Uterine contractions; Obesity in pregnancy; and Labor and delivery, indication for care on their problem list.  Patient reports no complaints.  She did have sex last week trying to induce labor. She has felt more cramping and feels like the baby dropped. Contractions: Irritability. Vag. Bleeding: None.  Movement: Present. Denies leaking of fluid.   The following portions of the patient's history were reviewed and updated as appropriate: allergies, current medications, past family history, past medical history, past social history, past surgical history and problem list.   Objective:    Vitals:   04/05/24 1308  BP: 117/70  Pulse: 83  Weight: 219 lb 3.2 oz (99.4 kg)    Fetal Status:  Fetal Heart Rate (bpm): 129 Fundal Height: 37 cm Movement: Present Presentation: Vertex  General: Alert, oriented and cooperative. Patient is in no acute distress.  Skin: Skin is warm and dry. No rash noted.   Cardiovascular: Normal heart rate noted  Respiratory: Normal respiratory effort, no problems with respiration noted  Abdomen: Soft, gravid, appropriate for gestational age.  Pain/Pressure: Absent     Pelvic: Cervical exam performed in the presence of a chaperone Dilation: 2   Station: -2 I stripped her membranes and got bloody mucous on my gloves.  Extremities: Normal range of motion.  Edema: None  Mental Status: Normal mood and affect. Normal behavior. Normal judgment and thought content.   Assessment and Plan:  Pregnancy: G3P1011 at [redacted]w[redacted]d  Per her request, I have scheduled  elective IOL on 04-08-2024. I expect a quick labor and delivery. Her GBS is negative.  Term labor symptoms and general obstetric precautions including but not limited to vaginal bleeding, contractions, leaking of fluid and fetal movement were reviewed in detail with the patient. Please refer to After Visit Summary for other counseling recommendations.   No follow-ups on file.  No future appointments.  Harland JAYSON Birkenhead, MD

## 2024-04-06 ENCOUNTER — Other Ambulatory Visit: Payer: Self-pay | Admitting: Obstetrics & Gynecology

## 2024-04-06 NOTE — Addendum Note (Signed)
 Addended by: STARLA BOLUS C on: 04/06/2024 11:06 AM   Modules accepted: Orders

## 2024-04-08 ENCOUNTER — Inpatient Hospital Stay: Admitting: Anesthesiology

## 2024-04-08 ENCOUNTER — Other Ambulatory Visit: Payer: Self-pay

## 2024-04-08 ENCOUNTER — Encounter: Payer: Self-pay | Admitting: Obstetrics & Gynecology

## 2024-04-08 ENCOUNTER — Inpatient Hospital Stay
Admission: EM | Admit: 2024-04-08 | Discharge: 2024-04-09 | DRG: 807 | Disposition: A | Discharge status: Home / Self Care | Attending: Licensed Practical Nurse | Admitting: Licensed Practical Nurse

## 2024-04-08 DIAGNOSIS — O35EXX0 Maternal care for other (suspected) fetal abnormality and damage, fetal genitourinary anomalies, not applicable or unspecified: Secondary | ICD-10-CM

## 2024-04-08 DIAGNOSIS — Z3490 Encounter for supervision of normal pregnancy, unspecified, unspecified trimester: Secondary | ICD-10-CM | POA: Diagnosis present

## 2024-04-08 DIAGNOSIS — O99214 Obesity complicating childbirth: Principal | ICD-10-CM | POA: Diagnosis present

## 2024-04-08 DIAGNOSIS — Z3A39 39 weeks gestation of pregnancy: Secondary | ICD-10-CM | POA: Diagnosis not present

## 2024-04-08 DIAGNOSIS — Z3480 Encounter for supervision of other normal pregnancy, unspecified trimester: Principal | ICD-10-CM

## 2024-04-08 DIAGNOSIS — O26893 Other specified pregnancy related conditions, third trimester: Secondary | ICD-10-CM | POA: Diagnosis present

## 2024-04-08 LAB — CBC
HCT: 31.7 % — ABNORMAL LOW (ref 36.0–46.0)
Hemoglobin: 10.5 g/dL — ABNORMAL LOW (ref 12.0–15.0)
MCH: 27.4 pg (ref 26.0–34.0)
MCHC: 33.1 g/dL (ref 30.0–36.0)
MCV: 82.8 fL (ref 80.0–100.0)
Platelets: 254 K/uL (ref 150–400)
RBC: 3.83 MIL/uL — ABNORMAL LOW (ref 3.87–5.11)
RDW: 14.4 % (ref 11.5–15.5)
WBC: 11.8 K/uL — ABNORMAL HIGH (ref 4.0–10.5)
nRBC: 0 % (ref 0.0–0.2)

## 2024-04-08 LAB — RPR: RPR Ser Ql: NONREACTIVE

## 2024-04-08 LAB — TYPE AND SCREEN
ABO/RH(D): A POS
Antibody Screen: NEGATIVE

## 2024-04-08 LAB — ABO/RH: ABO/RH(D): A POS

## 2024-04-08 MED ORDER — DOCUSATE SODIUM 100 MG PO CAPS: 100.0000 mg | ORAL_CAPSULE | Freq: Two times a day (BID) | ORAL | Status: DC

## 2024-04-08 MED ORDER — WITCH HAZEL-GLYCERIN EX PADS: 1.0000 | MEDICATED_PAD | CUTANEOUS | Status: DC | PRN

## 2024-04-08 MED ORDER — ONDANSETRON HCL 4 MG PO TABS
4.0000 mg | ORAL_TABLET | ORAL | Status: DC | PRN
Start: 2024-04-08 — End: 2024-04-10

## 2024-04-08 MED ORDER — DIBUCAINE (PERIANAL) 1 % EX OINT
1.0000 | TOPICAL_OINTMENT | CUTANEOUS | Status: DC | PRN
Start: 2024-04-08 — End: 2024-04-10

## 2024-04-08 MED ORDER — FENTANYL-BUPIVACAINE-NACL 0.5-0.125-0.9 MG/250ML-% EP SOLN
EPIDURAL | Status: AC
  Filled 2024-04-08: qty 250

## 2024-04-08 MED ORDER — EPHEDRINE 5 MG/ML INJ
10.0000 mg | INTRAVENOUS | Status: DC | PRN
  Filled 2024-04-08: qty 5

## 2024-04-08 MED ORDER — ONDANSETRON HCL 4 MG/2ML IJ SOLN: 4.0000 mg | INTRAMUSCULAR | Status: DC | PRN

## 2024-04-08 MED ORDER — ONDANSETRON HCL 4 MG/2ML IJ SOLN: 4.0000 mg | Freq: Four times a day (QID) | INTRAMUSCULAR | Status: DC | PRN

## 2024-04-08 MED ORDER — MISOPROSTOL 200 MCG PO TABS
ORAL_TABLET | ORAL | Status: AC
  Filled 2024-04-08: qty 4

## 2024-04-08 MED ORDER — BUPIVACAINE HCL (PF) 0.25 % IJ SOLN
INTRAMUSCULAR | Status: DC | PRN
  Administered 2024-04-08: 8 mL via EPIDURAL

## 2024-04-08 MED ORDER — EPHEDRINE 5 MG/ML INJ
10.0000 mg | INTRAVENOUS | Status: DC | PRN
  Administered 2024-04-08: 10 mg via INTRAVENOUS

## 2024-04-08 MED ORDER — SIMETHICONE 80 MG PO CHEW
80.0000 mg | CHEWABLE_TABLET | ORAL | Status: DC | PRN
Start: 2024-04-08 — End: 2024-04-10

## 2024-04-08 MED ORDER — MISOPROSTOL 25 MCG QUARTER TABLET
25.0000 ug | ORAL_TABLET | Freq: Once | ORAL | Status: AC
  Administered 2024-04-08: 25 ug via ORAL
  Filled 2024-04-08: qty 1

## 2024-04-08 MED ORDER — LIDOCAINE-EPINEPHRINE (PF) 1.5 %-1:200000 IJ SOLN
INTRAMUSCULAR | Status: DC | PRN
  Administered 2024-04-08: 3 mL via EPIDURAL
  Administered 2024-04-08: 2 mL via EPIDURAL

## 2024-04-08 MED ORDER — PHENYLEPHRINE 80 MCG/ML (10ML) SYRINGE FOR IV PUSH (FOR BLOOD PRESSURE SUPPORT): 80.0000 ug | PREFILLED_SYRINGE | INTRAVENOUS | Status: DC | PRN

## 2024-04-08 MED ORDER — TERBUTALINE SULFATE 1 MG/ML IJ SOLN: 0.2500 mg | Freq: Once | INTRAMUSCULAR | Status: DC | PRN

## 2024-04-08 MED ORDER — ZOLPIDEM TARTRATE 5 MG PO TABS
5.0000 mg | ORAL_TABLET | Freq: Every evening | ORAL | Status: DC | PRN
Start: 2024-04-08 — End: 2024-04-10

## 2024-04-08 MED ORDER — LACTATED RINGERS IV SOLN
500.0000 mL | Freq: Once | INTRAVENOUS | Status: AC
  Administered 2024-04-08: 500 mL via INTRAVENOUS

## 2024-04-08 MED ORDER — OXYCODONE-ACETAMINOPHEN 5-325 MG PO TABS: 2.0000 | ORAL_TABLET | ORAL | Status: DC | PRN

## 2024-04-08 MED ORDER — DIPHENHYDRAMINE HCL 50 MG/ML IJ SOLN: 12.5000 mg | INTRAMUSCULAR | Status: DC | PRN

## 2024-04-08 MED ORDER — MISOPROSTOL 50MCG HALF TABLET
ORAL_TABLET | ORAL | Status: AC
  Filled 2024-04-08: qty 1

## 2024-04-08 MED ORDER — SOD CITRATE-CITRIC ACID 500-334 MG/5ML PO SOLN: 30.0000 mL | ORAL | Status: DC | PRN

## 2024-04-08 MED ORDER — LIDOCAINE HCL (PF) 1 % IJ SOLN
30.0000 mL | INTRAMUSCULAR | Status: DC | PRN
  Filled 2024-04-08: qty 30

## 2024-04-08 MED ORDER — OXYTOCIN-SODIUM CHLORIDE 30-0.9 UT/500ML-% IV SOLN
2.5000 [IU]/h | INTRAVENOUS | Status: DC
  Administered 2024-04-08: 2.5 [IU]/h via INTRAVENOUS
  Filled 2024-04-08: qty 500

## 2024-04-08 MED ORDER — PRENATAL MULTIVITAMIN CH
1.0000 | ORAL_TABLET | Freq: Every day | ORAL | Status: DC
  Administered 2024-04-09: 1 via ORAL
  Filled 2024-04-08: qty 1

## 2024-04-08 MED ORDER — AMMONIA AROMATIC IN INHA
RESPIRATORY_TRACT | Status: AC
  Filled 2024-04-08: qty 10

## 2024-04-08 MED ORDER — COCONUT OIL OIL
1.0000 | TOPICAL_OIL | Status: DC | PRN
  Filled 2024-04-08: qty 22.5

## 2024-04-08 MED ORDER — ACETAMINOPHEN 500 MG PO TABS
1000.0000 mg | ORAL_TABLET | Freq: Four times a day (QID) | ORAL | Status: DC
  Administered 2024-04-09: 1000 mg via ORAL
  Filled 2024-04-08: qty 2

## 2024-04-08 MED ORDER — OXYTOCIN-SODIUM CHLORIDE 30-0.9 UT/500ML-% IV SOLN
1.0000 m[IU]/min | INTRAVENOUS | Status: DC
  Administered 2024-04-08: 2 m[IU]/min via INTRAVENOUS

## 2024-04-08 MED ORDER — OXYCODONE-ACETAMINOPHEN 5-325 MG PO TABS: 1.0000 | ORAL_TABLET | ORAL | Status: DC | PRN

## 2024-04-08 MED ORDER — DIPHENHYDRAMINE HCL 25 MG PO CAPS: 25.0000 mg | ORAL_CAPSULE | Freq: Four times a day (QID) | ORAL | Status: DC | PRN

## 2024-04-08 MED ORDER — LACTATED RINGERS IV SOLN: 500.0000 mL | INTRAVENOUS | Status: DC | PRN

## 2024-04-08 MED ORDER — ACETAMINOPHEN 325 MG PO TABS: 650.0000 mg | ORAL_TABLET | ORAL | Status: DC | PRN

## 2024-04-08 MED ORDER — MISOPROSTOL 25 MCG QUARTER TABLET
25.0000 ug | ORAL_TABLET | Freq: Once | ORAL | Status: AC
  Administered 2024-04-08: 25 ug via VAGINAL
  Filled 2024-04-08: qty 1

## 2024-04-08 MED ORDER — OXYTOCIN 10 UNIT/ML IJ SOLN
INTRAMUSCULAR | Status: AC
  Filled 2024-04-08: qty 2

## 2024-04-08 MED ORDER — MISOPROSTOL 25 MCG QUARTER TABLET
50.0000 ug | ORAL_TABLET | ORAL | Status: DC
  Administered 2024-04-08: 50 ug via VAGINAL

## 2024-04-08 MED ORDER — FENTANYL-BUPIVACAINE-NACL 0.5-0.125-0.9 MG/250ML-% EP SOLN
12.0000 mL/h | EPIDURAL | Status: DC | PRN
  Administered 2024-04-08: 12 mL/h via EPIDURAL

## 2024-04-08 MED ORDER — IBUPROFEN 600 MG PO TABS
600.0000 mg | ORAL_TABLET | Freq: Four times a day (QID) | ORAL | Status: DC
  Administered 2024-04-09: 600 mg via ORAL
  Filled 2024-04-08: qty 1

## 2024-04-08 MED ORDER — DOCUSATE SODIUM 100 MG PO CAPS
100.0000 mg | ORAL_CAPSULE | Freq: Two times a day (BID) | ORAL | Status: DC
  Administered 2024-04-09: 100 mg via ORAL
  Filled 2024-04-08: qty 1

## 2024-04-08 MED ORDER — OXYTOCIN BOLUS FROM INFUSION
333.0000 mL | Freq: Once | INTRAVENOUS | Status: AC
  Administered 2024-04-08: 333 mL via INTRAVENOUS

## 2024-04-08 MED ORDER — LACTATED RINGERS IV SOLN: INTRAVENOUS | Status: DC

## 2024-04-08 MED ORDER — BENZOCAINE-MENTHOL 20-0.5 % EX AERO: 1.0000 | INHALATION_SPRAY | CUTANEOUS | Status: DC | PRN

## 2024-04-08 NOTE — Discharge Instructions (Signed)

## 2024-04-08 NOTE — Anesthesia Procedure Notes (Signed)
 Epidural Patient location during procedure: OB Start time: 04/08/2024 5:21 PM End time: 04/08/2024 5:29 PM  Staffing Anesthesiologist: Shellie Odor, MD Performed: anesthesiologist   Preanesthetic Checklist Completed: patient identified, IV checked, risks and benefits discussed, surgical consent, monitors and equipment checked, pre-op evaluation and timeout performed  Epidural Patient position: sitting Prep: ChloraPrep Patient monitoring: heart rate, continuous pulse ox and blood pressure Approach: midline Location: L3-L4 Injection technique: LOR air  Needle:  Needle type: Tuohy  Needle gauge: 17 G Needle length: 9 cm Needle insertion depth: 6 cm Catheter at skin depth: 11 cm Test dose: negative and 1.5% lidocaine  with Epi 1:200 K  Assessment Sensory level: T4  Additional Notes Straightforward placement without apparent complications. Reason for block:procedure for pain

## 2024-04-08 NOTE — Progress Notes (Signed)
 Aimee Jackson is a 33 y.o. G3P1011 at [redacted]w[redacted]d by LMP admitted for induction of labor due to Elective at term.  Subjective: Currently sitting up for epidural   Objective: BP 121/60   Pulse 72   Temp 98.1 F (36.7 C) (Oral)   Resp 18   Ht 5' 5 (1.651 m)   Wt 99.8 kg   LMP 07/08/2023 (Exact Date)   BMI 36.61 kg/m  No intake/output data recorded. No intake/output data recorded.  FHT:  FHR: 135 bpm, variability: moderate,  accelerations:  Present,  decelerations:  Present variables present  UC:   regular, every 1-*3 minutes SVE:   Dilation: 7 Effacement (%): 80 Station: -2 Exam by:: A Franks RN Pitocin  at [employer]: Lab Results  Component Value Date   WBC 11.8 (H) 04/08/2024   HGB 10.5 (L) 04/08/2024   HCT 31.7 (L) 04/08/2024   MCV 82.8 04/08/2024   PLT 254 04/08/2024    Assessment / Plan: Induction of labor due to elective at term,  progressing well on pitocin   Labor: Titrate Pitocin  as needed   Fetal Wellbeing:  Category II, pt currently siting up for epidural  Pain Control:  Anesthesia present for epidural  I/D:  GSB negative, membranes intact  Anticipated MOD:  NSVD  [name] Cicely Ortner, CNM 04/08/2024, 5:25 PM

## 2024-04-08 NOTE — Progress Notes (Signed)
 Aimee Jackson is a 33 y.o. G3P1011 at [redacted]w[redacted]d by LMP admitted for induction of labor due to elective at term.  Subjective: Feelign some cramping, offered ripening balloon, accepted   Objective: BP 109/63 (BP Location: Right Arm)   Pulse 75   Temp 98 F (36.7 C) (Oral)   Resp 18   Ht 5' 5 (1.651 m)   Wt 99.8 kg   LMP 07/08/2023 (Exact Date)   BMI 36.61 kg/m  No intake/output data recorded. No intake/output data recorded.  FHT:  FHR: 130 bpm, variability: moderate,  accelerations:  Present,  decelerations:  Absent UC:   irregular, every 1-4 minutes SVE:   Dilation: 2 Effacement (%): 50 Station: -3 Exam by:: Aimee Jackson CNM  Labs: Lab Results  Component Value Date   WBC 11.8 (H) 04/08/2024   HGB 10.5 (Aimee) 04/08/2024   HCT 31.7 (Aimee) 04/08/2024   MCV 82.8 04/08/2024   PLT 254 04/08/2024    Assessment / Plan: Elective IOL, ripening stage  Labor: Cytotec  #2 placed around 1015, Cook's catheter easily inserted just now.  Fetal Wellbeing:  Category I Pain Control:  aware of all options, will ask if desired I/D:  GBS negative, membranes intact  Anticipated MOD:  NSVD  [name] Aimee Jackson, CNM 04/08/2024, 12:27 PM

## 2024-04-08 NOTE — H&P (Signed)
 History and Physical   HPI  Aimee Jackson is a 33 y.o. G3P1011 at [redacted]w[redacted]d Estimated Date of Delivery: 04/13/24 who is being admitted for induction of labor, elective.    OB History  OB History  Gravida Para Term Preterm AB Living  3 1 1  0 1 1  SAB IAB Ectopic Multiple Live Births  1 0 0 0 1    # Outcome Date GA Lbr Len/2nd Weight Sex Type Anes PTL Lv  3 Current           2 Term 02/10/19 [redacted]w[redacted]d  2750 g M Vag-Spont EPI  LIV  1 SAB 2019            PROBLEM LIST  Pregnancy complications or risks: Patient Active Problem List   Diagnosis Date Noted   Encounter for planned induction of labor 04/08/2024   Labor and delivery, indication for care 03/26/2024   Obesity in pregnancy 03/22/2024   Uterine contractions 03/16/2024   Choroid plexus cyst of fetus affecting care of mother, antepartum 02/09/2024   Pyelectasis of fetus on prenatal ultrasound 11/23/2023   History of depression 11/03/2023   Anxiety, generalized 11/11/2019   Supervision of other normal pregnancy, antepartum 07/06/2018    Prenatal labs and studies: ABO, Rh: --/--/A POS (08/21 0525) Antibody: NEG (08/21 0525) Rubella: 9.90 (02/17 1541) RPR: Non Reactive (06/09 0922)  HBsAg: Negative (02/17 1541)  HIV: Non Reactive (06/09 9077)  HAD:Wzhjupcz/-- (07/31 1028)   Past Medical History:  Diagnosis Date   Anxiety      Past Surgical History:  Procedure Laterality Date   TONSILECTOMY, ADENOIDECTOMY, BILATERAL MYRINGOTOMY AND TUBES       Medications    Current Discharge Medication List     CONTINUE these medications which have NOT CHANGED   Details  ferrous sulfate 324 MG TBEC Take 20 mg by mouth.    Prenatal Vit-Fe Fumarate-FA (MULTIVITAMIN-PRENATAL) 27-0.8 MG TABS tablet Take 1 tablet by mouth daily at 12 noon.    sertraline (ZOLOFT) 50 MG tablet Take 50 mg by mouth daily.         Allergies  Aleve [naproxen], Naproxen sodium, and Codeine  Review of Systems  Constitutional:  negative Respiratory: negative Cardiovascular: negative Gastrointestinal: negative Genitourinary:negative Integument/breast: negative Hematologic/lymphatic: negative Musculoskeletal:negative Neurological: negative Behavioral/Psych: negative Endocrine: negative Allergic/Immunologic: negative  Physical Exam  BP 127/63   Pulse 96   Temp 98.2 F (36.8 C) (Oral)   Resp 16   Ht 5' 5 (1.651 m)   Wt 99.8 kg   LMP 07/08/2023 (Exact Date)   BMI 36.61 kg/m   Lungs:  CTA B Cardio: RRR without M/R/G Abd: Soft, gravid, NT Presentation: cephalic EXT: No C/C/ 1+ Edema DTRs: 2+ B CERVIX: Dilation: 2 Effacement (%): Thick Station: -3 Exam by:: Aimee Free RN  See Prenatal records for more detailed PE.     FHR:  Baseline: 140 bpm, Variability: Good {> 6 bpm), Accelerations: Reactive, and Decelerations: Absent  Toco: Uterine Contractions: irritability   Test Results  Results for orders placed or performed during the hospital encounter of 04/08/24 (from the past 24 hours)  CBC     Status: Abnormal   Collection Time: 04/08/24  5:25 AM  Result Value Ref Range   WBC 11.8 (H) 4.0 - 10.5 K/uL   RBC 3.83 (L) 3.87 - 5.11 MIL/uL   Hemoglobin 10.5 (L) 12.0 - 15.0 g/dL   HCT 68.2 (L) 63.9 - 53.9 %   MCV 82.8 80.0 -  100.0 fL   MCH 27.4 26.0 - 34.0 pg   MCHC 33.1 30.0 - 36.0 g/dL   RDW 85.5 88.4 - 84.4 %   Platelets 254 150 - 400 K/uL   nRBC 0.0 0.0 - 0.2 %  Type and screen     Status: None   Collection Time: 04/08/24  5:25 AM  Result Value Ref Range   ABO/RH(D) A POS    Antibody Screen NEG    Sample Expiration      04/11/2024,2359 Performed at [facility], [address] [place], [place] [address]    Group B Strep negative  Assessment   G3P1011 at [redacted]w[redacted]d Estimated Date of Delivery: 04/13/24  The fetus is reassuring.   Patient Active Problem List   Diagnosis Date Noted   Encounter for planned induction of labor 04/08/2024   Labor and delivery, indication  for care 03/26/2024   Obesity in pregnancy 03/22/2024   Uterine contractions 03/16/2024   Choroid plexus cyst of fetus affecting care of mother, antepartum 02/09/2024   Pyelectasis of fetus on prenatal ultrasound 11/23/2023   History of depression 11/03/2023   Anxiety, generalized 11/11/2019   Supervision of other normal pregnancy, antepartum 07/06/2018    Plan  1. Admit to L&D :   2. EFM:-- Category 1 3. IV pain medication, nitrous gas, or Epidural if desired.   4. Admission labs  5. Anticipate NSVD 6. Dr. Lovetta aware of admission and plan of care.   Aimee Jackson, CNM  04/08/2024 6:52 AM

## 2024-04-08 NOTE — Discharge Summary (Signed)
 Postpartum Discharge Summary  Date of Service updated***     Patient Name: Aimee Jackson DOB: 08-Mar-1991 MRN: [id number]  Date of admission: 04/08/2024 Delivery date:04/08/2024 Delivering provider: DELINDA MELNICK MARIE Date of discharge: 04/08/2024  Admitting diagnosis: Encounter for planned induction of labor [Z34.90] Intrauterine pregnancy: [redacted]w[redacted]d     Secondary diagnosis:  Principal Problem:   Encounter for planned induction of labor  Additional problems: NA    Discharge diagnosis: Term Pregnancy Delivered                                              Post partum procedures:{Postpartum procedures:23558} Augmentation: Pitocin , Cytotec , and IP Foley Complications: None  Hospital course: Induction of Labor With Vaginal Delivery   33 y.o. yo G3P1011 at [redacted]w[redacted]d was admitted to the hospital 04/08/2024 for induction of labor.  Indication for induction: Elective.  Patient had an labor course complicated byNA Membrane Rupture Time/Date: 6:45 PM,04/08/2024  Delivery Method:Vaginal, Spontaneous Operative Delivery:N/A Episiotomy: None Lacerations:  None Details of delivery can be found in separate delivery note.  Patient had a postpartum course complicated by***. Patient is discharged home 04/08/24.  Newborn Data: Birth date:04/08/2024 Birth time:7:44 PM Gender:Female Living status:Living Apgars:8 ,9  Weight:   Magnesium Sulfate received: No BMZ received: No Rhophylac:N/A MMR:N/A T-DaP:Given prenatally Flu: N/A RSV Vaccine received: No Transfusion:{Transfusion received:30440034} Immunizations administered: Immunization History  Administered Date(s) Administered   Influenza Inj Mdck Quad Pf 08/11/2018, 06/27/2021   Tdap 11/23/2018, 05/04/2019, 01/26/2024    Physical exam  Vitals:   04/08/24 1855 04/08/24 1856 04/08/24 1900 04/08/24 1905  BP:    (!) 101/50  Pulse:    72  Resp:      Temp:  97.9 F (36.6 C)    TempSrc:  Oral    SpO2: 100%  99% 100%  Weight:       Height:       General: {Exam; general:21111117} Lochia: {Desc; appropriate/inappropriate:30686::appropriate} Uterine Fundus: {Desc; firm/soft:30687} Incision: {Exam; incision:21111123} DVT Evaluation: {Exam; dvt:2111122} Labs: Lab Results  Component Value Date   WBC 11.8 (H) 04/08/2024   HGB 10.5 (L) 04/08/2024   HCT 31.7 (L) 04/08/2024   MCV 82.8 04/08/2024   PLT 254 04/08/2024      Latest Ref Rng & Units 11/15/2023    1:15 PM  CMP  Glucose 70 - 99 mg/dL 87   BUN 6 - 20 mg/dL 7   Creatinine 9.55 - 8.99 mg/dL 9.47   Sodium 864 - 854 mmol/L 136   Potassium 3.5 - 5.1 mmol/L 3.6   Chloride 98 - 111 mmol/L 104   CO2 22 - 32 mmol/L 24   Calcium 8.9 - 10.3 mg/dL 9.3   Total Protein 6.5 - 8.1 g/dL 7.2   Total Bilirubin 0.0 - 1.2 mg/dL 0.4   Alkaline Phos 38 - 126 U/L 57   AST 15 - 41 U/L 18   ALT 0 - 44 U/L 18    Edinburgh Score:     No data to display            After visit meds:  Allergies as of 04/08/2024       Reactions   Aleve [naproxen] Anaphylaxis   Naproxen Sodium Anaphylaxis, Swelling   Codeine Nausea And Vomiting     Med Rec must be completed prior to using this [facility]***  Discharge home in stable condition Infant Feeding: Bottle Infant Disposition:home with mother Discharge instruction: per After Visit Summary and Postpartum booklet. Activity: Advance as tolerated. Pelvic rest for 6 weeks.  Diet: routine diet Anticipated Birth Control: {Birth Control:23956} Postpartum Appointment:6 weeks Additional Postpartum F/U: Postpartum Depression checkup 2 wks  Future Appointments:No future appointments. Follow up Visit:  Follow-up Information     Dominic, Jinnie Jansky, CNM Follow up in 2 week(s).   Specialty: Obstetrics and Gynecology Why: 2wk virtual and 6wk in person Contact information: [address] [place] [address] [phone]                     04/08/2024 [name] DOMINIC, CNM

## 2024-04-08 NOTE — Anesthesia Preprocedure Evaluation (Signed)
 Anesthesia Evaluation  Patient identified by MRN, date of birth, ID band Patient awake    Reviewed: Allergy & Precautions, NPO status , Patient's Chart, lab work & pertinent test results  History of Anesthesia Complications Negative for: history of anesthetic complications  Airway Mallampati: II   Neck ROM: Full    Dental   Pulmonary neg pulmonary ROS   Pulmonary exam normal breath sounds clear to auscultation       Cardiovascular Exercise Tolerance: Good negative cardio ROS Normal cardiovascular exam Rhythm:Regular Rate:Normal  ECG 11/17/23: normal  Echo 05/24/21:    NORMAL LEFT VENTRICULAR SYSTOLIC FUNCTION    NORMAL LA PRESSURES WITH NORMAL DIASTOLIC FUNCTION    NORMAL RIGHT VENTRICULAR SYSTOLIC FUNCTION    VALVULAR REGURGITATION: TRIVIAL AR, TRIVIAL MR, TRIVIAL PR, TRIVIAL TR    NO VALVULAR STENOSIS    NO PRIOR STUDY FOR COMPARISON    Neuro/Psych  PSYCHIATRIC DISORDERS Anxiety Depression    negative neurological ROS     GI/Hepatic negative GI ROS,,,  Endo/Other  Obesity   Renal/GU negative Renal ROS     Musculoskeletal   Abdominal   Peds  Hematology negative hematology ROS (+)   Anesthesia Other Findings 33 yo G3P1011 at 25 2/7 requesting labor epidural.  Reproductive/Obstetrics (+) Pregnancy                              Anesthesia Physical Anesthesia Plan  ASA: 2  Anesthesia Plan: Epidural   Post-op Pain Management:    Induction:   PONV Risk Score and Plan: 2 and Treatment may vary due to age or medical condition  Airway Management Planned: Natural Airway  Additional Equipment:   Intra-op Plan:   Post-operative Plan:   Informed Consent: I have reviewed the patients History and Physical, chart, labs and discussed the procedure including the risks, benefits and alternatives for the proposed anesthesia with the patient or authorized representative who has indicated  his/her understanding and acceptance.     Dental Advisory Given  Plan Discussed with:   Anesthesia Plan Comments: (Patient reports no bleeding problems and no anticoagulant use.   Patient consented for risks of anesthesia including but not limited to:  - adverse reactions to medications - risk of bleeding, infection and or nerve damage from epidural that could lead to paralysis - risk of headache or failed epidural - nerve damage due to positioning - that if epidural is used for C-section that there is a chance of epidural failure requiring spinal placement or conversion to GA - damage to heart, brain, lungs, other parts of body or loss of life  Patient voiced understanding and assent.)        Anesthesia Quick Evaluation

## 2024-04-09 LAB — CBC
HCT: 29.7 % — ABNORMAL LOW (ref 36.0–46.0)
Hemoglobin: 9.8 g/dL — ABNORMAL LOW (ref 12.0–15.0)
MCH: 27.5 pg (ref 26.0–34.0)
MCHC: 33 g/dL (ref 30.0–36.0)
MCV: 83.2 fL (ref 80.0–100.0)
Platelets: 234 K/uL (ref 150–400)
RBC: 3.57 MIL/uL — ABNORMAL LOW (ref 3.87–5.11)
RDW: 14.4 % (ref 11.5–15.5)
WBC: 17.8 K/uL — ABNORMAL HIGH (ref 4.0–10.5)
nRBC: 0 % (ref 0.0–0.2)

## 2024-04-09 MED ORDER — IBUPROFEN 600 MG PO TABS
600.0000 mg | ORAL_TABLET | Freq: Four times a day (QID) | ORAL | Status: DC
  Administered 2024-04-09 (×2): 600 mg via ORAL
  Filled 2024-04-09 (×2): qty 1

## 2024-04-09 MED ORDER — ACETAMINOPHEN 500 MG PO TABS: 1000.0000 mg | ORAL_TABLET | Freq: Four times a day (QID) | ORAL | 0 refills | Status: AC

## 2024-04-09 MED ORDER — BENZOCAINE-MENTHOL 20-0.5 % EX AERO: 1.0000 | INHALATION_SPRAY | CUTANEOUS | Status: AC | PRN

## 2024-04-09 MED ORDER — WITCH HAZEL-GLYCERIN EX PADS: 1.0000 | MEDICATED_PAD | CUTANEOUS | 12 refills | Status: AC | PRN

## 2024-04-09 MED ORDER — DOCUSATE SODIUM 100 MG PO CAPS: 100.0000 mg | ORAL_CAPSULE | Freq: Two times a day (BID) | ORAL | 0 refills | Status: AC | PRN

## 2024-04-09 MED ORDER — ACETAMINOPHEN 500 MG PO TABS
1000.0000 mg | ORAL_TABLET | Freq: Four times a day (QID) | ORAL | Status: DC
  Administered 2024-04-09 (×2): 1000 mg via ORAL
  Filled 2024-04-09 (×2): qty 2

## 2024-04-09 MED ORDER — DIBUCAINE (PERIANAL) 1 % EX OINT: 1.0000 | TOPICAL_OINTMENT | CUTANEOUS | Status: AC | PRN

## 2024-04-09 MED ORDER — IBUPROFEN 600 MG PO TABS: 600.0000 mg | ORAL_TABLET | Freq: Four times a day (QID) | ORAL | 0 refills | Status: AC

## 2024-04-09 NOTE — Anesthesia Postprocedure Evaluation (Signed)
 Anesthesia Post Note  Patient: Aimee Jackson  Procedure(s) Performed: AN AD HOC LABOR EPIDURAL  Patient location during evaluation: Mother Baby Anesthesia Type: Epidural Level of consciousness: awake and alert and oriented Pain management: satisfactory to patient Vital Signs Assessment: post-procedure vital signs reviewed and stable Respiratory status: spontaneous breathing Cardiovascular status: stable Postop Assessment: able to ambulate, adequate PO intake and no apparent nausea or vomiting Anesthetic complications: no Comments: Has been able to void.   No notable events documented.   Last Vitals:  Vitals:   04/09/24 0000 04/09/24 0300  BP: 117/73 119/67  Pulse: 80 78  Resp: 18 16  Temp: 36.6 C 36.8 C  SpO2: 99% 98%    Last Pain:  Vitals:   04/09/24 0300  TempSrc: Oral  PainSc: 2                  Ithiel Liebler Dyane

## 2024-04-09 NOTE — Lactation Note (Signed)
 This note was copied from a baby's chart. Lactation Consultation Note  Patient Name: Aimee Jackson Date: 04/09/2024 Age:34 hours Reason for consult: Follow-up assessment;Term   Maternal Data This is mom's 2nd baby, SVD. Mom with a history of anxiety, depression, and obesity.  On follow-up visit mom was independently breastfeeding when [facility] entered the room. Mom with some normal nipple tenderness, nipples are intact. Per mom baby has been breastfeeding well since birth with the exception of one sleepy period, has wet and had 4 stool diapers. Has patient been taught Hand Expression?: Yes Does the patient have breastfeeding experience prior to this delivery?: Yes How long did the patient breastfeed?:  (mom reports she breastfed for a few months)  Feeding Mother's Current Feeding Choice: Breast Milk Provided tips and strategies to maximize position and latch techniques. Discussed nutritive vs. non-nutritive sucking. Recommended mom provide tactile stimulation to keep baby interested in feeding when the baby appears to be sucking non-nutritively. Baby with multiple swallows noted by mom and LC.  LATCH Score Latch: Grasps breast easily, tongue down, lips flanged, rhythmical sucking.  Audible Swallowing: Spontaneous and intermittent  Type of Nipple: Everted at rest and after stimulation  Comfort (Breast/Nipple): Soft / non-tender (Mom with normative nipple tenderness, coconut oil provided.)  Hold (Positioning): Assistance needed to correctly position infant at breast and maintain latch.  LATCH Score: 9  Interventions Interventions: Breast feeding basics reviewed;Breast compression;Adjust position;Support pillows;Coconut oil;Education  Discharge Discharge Education: Engorgement and breast care;Warning signs for feeding baby;Outpatient recommendation Pump: Personal;DEBP;Manual  Consult Status Consult Status: Complete Date: 04/09/24 Follow-up type: In-patient  Update  provided to care nurse  Aimee Jackson 04/09/2024, 4:57 PM

## 2024-04-09 NOTE — Progress Notes (Signed)
Pt discharged with infant.  Discharge instructions, prescriptions and follow up appointment given to and reviewed with pt. Pt verbalized understanding.  

## 2024-04-11 ENCOUNTER — Encounter: Payer: Self-pay | Admitting: Certified Nurse Midwife

## 2024-04-20 ENCOUNTER — Encounter: Payer: Self-pay | Admitting: Licensed Practical Nurse

## 2024-04-20 ENCOUNTER — Telehealth (INDEPENDENT_AMBULATORY_CARE_PROVIDER_SITE_OTHER): Admitting: Licensed Practical Nurse

## 2024-04-20 DIAGNOSIS — Z1332 Encounter for screening for maternal depression: Secondary | ICD-10-CM | POA: Diagnosis not present

## 2024-04-20 DIAGNOSIS — Z30011 Encounter for initial prescription of contraceptive pills: Secondary | ICD-10-CM

## 2024-04-20 DIAGNOSIS — K648 Other hemorrhoids: Secondary | ICD-10-CM

## 2024-04-20 MED ORDER — NORETHINDRONE 0.35 MG PO TABS: 1.0000 | ORAL_TABLET | Freq: Every day | ORAL | 11 refills | Status: AC

## 2024-04-20 NOTE — Progress Notes (Signed)
 Virtual Visit via Video Note  I connected with Aleck Manuel on 04/20/24 at 10:15 AM EDT by a video enabled telemedicine application and verified that I am speaking with the correct person using two identifiers.  Location: Patient: home in [place], [place] Provider: office in [place], [place]    I discussed the limitations of evaluation and management by telemedicine and the availability of in person appointments. The patient expressed understanding and agreed to proceed.  Histo/ry of Present Illness: SVB 04/08/24 with LMD, first degree laceration with repair baby girl 7lbs 3oz  -Hemorrhoids are improving, has been using tucks pad and a cream. Is concerned they will come back, she would like to look into getting them removed.   -Bleeding is light, spotting only -Sleep is good, gets 3 hour stretches -No concerns with voiding or stooling  -Stitches are healing, at first they were irritating but now they do not bother her -Exclusively pumping, infant can go to breast but Maelani prefers to pump to see the volume. Pumps every 2-3 hours, gets 3oz  total,  -Appetite is good: sometimes too busy to eat so has been adding smoothies or a protein drink  -Her husband has home, he goes back to work on Monday, family has been around to visit. Her older chid is adjusting to the newborn-has acted out some.  -has gone outside, but has not gone for walks d/t the hemorrhoid discomfort.  -Does not desire another pregnancy, Her husband has a consult for a vasectomy this Friday. She would like to use something until he is cleared. Has had a bad experience with the ParaGard, gained weigh tor felt off with Nexplanon.  -Mood has been good. Has had some anxiety but this is not new to her and has been manageable. She has used Klonopin  with good results, per lact med this medication has a long half life, you may see a suppressed infant rec Oxazepam as an alternative d/t its short half life and little gets into the  breatmilk.  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   Observations/Objective:  GEN: NAD EPDS 2  Assessment and Plan:  Normal exam of lactating woman  Screening for maternal depression   Follow Up Instructions: -Referral to GI to address Hemorrhoids placed -Script for Micronor  sent: reviewed risks/benefits/side effects -Will discuss using Oxazepam instead of Klonipoin with her provider  -may start to increase physical activity -Pap up to date     I discussed the assessment and treatment plan with the patient. The patient was provided an opportunity to ask questions and all were answered. The patient agreed with the plan and demonstrated an understanding of the instructions.   The patient was advised to call back or seek an in-person evaluation if the symptoms worsen or if the condition fails to improve as anticipated.  I provided 30 minutes of non-face-to-face time during this encounter.   [name] Jaideep Pollack, CNM

## 2024-04-22 ENCOUNTER — Encounter: Payer: Self-pay | Admitting: Licensed Practical Nurse

## 2024-04-23 ENCOUNTER — Other Ambulatory Visit: Payer: Self-pay

## 2024-04-23 ENCOUNTER — Other Ambulatory Visit: Payer: Self-pay | Admitting: Licensed Practical Nurse

## 2024-04-23 DIAGNOSIS — N61 Mastitis without abscess: Secondary | ICD-10-CM

## 2024-04-23 DIAGNOSIS — K648 Other hemorrhoids: Secondary | ICD-10-CM

## 2024-04-23 MED ORDER — DICLOXACILLIN SODIUM 500 MG PO CAPS: 500.0000 mg | ORAL_CAPSULE | Freq: Four times a day (QID) | ORAL | 0 refills | Status: AC

## 2024-04-23 NOTE — Progress Notes (Signed)
 Pt desires treatment for hemorrhoids, was referred to GI, was told they would only do creams and that she needs to see a Development worker, international aid. referral for general surgery placed Jinnie Cookey, [place]  [facility] 04/23/24  8:45 AM

## 2024-04-23 NOTE — Progress Notes (Signed)
 Patient reports pain, warmth and redness in her left breast along with chills.  She denies a fever at this time.  Mastitis education provided.  Antibiotic sent to patient pharmacy.

## 2024-05-09 NOTE — Lactation Note (Signed)
 This note was copied from a baby's chart. Lactation Consultation Note  Patient Name: Nimah Uphoff Unijb'd Date: 05/09/2024 Age:34 wk.o.     Maternal Data Lactation met w/ baby girl Clara and mom today to assess infants mouth for a lip tie, and assess moms breast.  Mom stated that infant takes a long time to feed on the bottle. She is primarily pumping, but feeding session w/ Clara are taking about 30 minutes.  She is concerned that infant cannot flare top lip out making it hard to suckle on the bottle.    Mom is using a Avent Natural, Avent Natural Response, and Avent Anti-Colic bottles.    Feeding Infants is able to lift, lateralize and extend past gum line.  LC does not suspect a tongue tie.  Top lip is restricted and therefore unable to flare out when eating at a bottle.  LC tried all the different bottle nipples with infant w/ pace feeding.  Infant eats very slowly on the nipple of all the bottles.  Infant seemed to eat best on the Avent Natural.   Lactation Tools Discussed/Used Mom is pumping with a 24mm flange.  She is using a Elvie pump.  While pumping mom thought that increasing suction would increase amt of milk out of the breast.  After 15 minutes of pumping,  LC noticed that on the outer quadrants of her breast that this was still very full. The Spectra  pumped was then hooked up with lubricated with coconut oil, during this time for only 5 minutes mom was able to soften the breast tissue from both breast.   Interventions -LC recommends that mom possibly look into bottles with a higher flowing nipple.   - Reach out to a pediatric dentist, or EENT to have a second evaluation of lip tie. LC provided mom with several options to reach out too. - Not to pump breast with a high suction or for longer than 20 minutes. - Apply coconut oil to inner portion of flanges prior to pumping.   - Sore Nipple soft shells were provided to help in the healing process of the nipples.    Discharge LC informed mom she can always reach back out to lactation if she has further questions.    Ah Bott S Benjamyn Hestand 05/09/2024, 3:20 PM

## 2024-05-19 ENCOUNTER — Encounter: Payer: Self-pay | Admitting: Licensed Practical Nurse

## 2024-05-19 ENCOUNTER — Ambulatory Visit: Admitting: Licensed Practical Nurse

## 2024-05-19 DIAGNOSIS — Z1332 Encounter for screening for maternal depression: Secondary | ICD-10-CM

## 2024-05-19 NOTE — Progress Notes (Signed)
 Postpartum Visit  Chief Complaint:  Chief Complaint  Patient presents with   Postpartum Care    6 week postpartum    History of Present Illness: Patient is a 33 y.o. G3P1011 presents for postpartum visit.   Review the Delivery Report for details.   Date of delivery: .04/08/2024 Type of delivery: Vaginal delivery - Vacuum or forceps assisted  no Episiotomy No.  Laceration: yes 1st degree with repair  Pregnancy or labor problems:  no Any problems since the delivery:  no  -Hemorrhoids have improved, no longer considering surgery  -has had some irritation around the urethra  -Bleeding has stopped -Sleep is good gets 2 to 4 hour stretches -Breastfeeding, pumping, saw LC, turns out she was not emptying her breast completely,  -has gone for walks and spends time outside -returns to work in December -Husband scheduled for vasectomy in December   Newborn Details:  SINGLETON :  1. BabyGender female. Birth weight: 7lbs 3oz  Maternal Details:  Breast or formula feeding: exclusively pumping, supplement with formula  Intercourse: No  Contraception: started POP 1 week ago  Any bowel or bladder issues: No  Post partum depression/anxiety noted:  no, but knows irregular sleep is a trigger and her youngest is not adjusting well so she increased her Zoloft to 100mg  daily, feeling good. Edinburgh Post-Partum Depression Score:3 Date of last PAP: 2024  no abnormalities   Review of Systems: ROS see HPI   The following portions of the patient's history were reviewed and updated as appropriate: allergies, current medications, past family history, past medical history, past social history, past surgical history, and problem list.  Past Medical History:  Past Medical History:  Diagnosis Date   Anxiety     Past Surgical History:  Past Surgical History:  Procedure Laterality Date   TONSILECTOMY, ADENOIDECTOMY, BILATERAL MYRINGOTOMY AND TUBES      Family History:  Family History   Problem Relation Age of Onset   Healthy Mother     Social History:  Social History   Socioeconomic History   Marital status: Married    Spouse name: Chief of Staff   Number of children: Not on file   Years of education: Not on file   Highest education level: Not on file  Occupational History   Not on file  Tobacco Use   Smoking status: Never   Smokeless tobacco: Never  Vaping Use   Vaping status: Former  Substance and Sexual Activity   Alcohol use: Not Currently    Comment: occasionally   Drug use: Never   Sexual activity: Yes    Birth control/protection: None  Other Topics Concern   Not on file  Social History Narrative   Not on file   Social Drivers of Health   Financial Resource Strain: Low Risk  (03/04/2023)   Received from [facility]   Overall Financial Resource Strain (CARDIA)    Difficulty of Paying Living Expenses: Not hard at all  Food Insecurity: No Food Insecurity (04/08/2024)   Hunger Vital Sign    Worried About Running Out of Food in the Last Year: Never true    Ran Out of Food in the Last Year: Never true  Transportation Needs: No Transportation Needs (04/08/2024)   PRAPARE - Administrator, Civil Service (Medical): No    Lack of Transportation (Non-Medical): No  Physical Activity: Insufficiently Active (02/09/2019)   Received from [facility]   Exercise Vital Sign    On  average, how many days per week do you engage in moderate to strenuous exercise (like a brisk walk)?: 3 days    On average, how many minutes do you engage in exercise at this level?: 30 min  Stress: No Stress Concern Present (02/09/2019)   Received from [facility] - Occupational Stress Questionnaire    Feeling of Stress : Only a little  Social Connections: Moderately Integrated (09/12/2023)   Social Connection and Isolation Panel    Frequency of Communication with Friends and Family: Three times a week     Frequency of Social Gatherings with Friends and Family: Once a week    Attends Religious Services: 1 to 4 times per year    Active Member of [employer] or Organizations: No    Attends Banker Meetings: Not on file    Marital Status: Married  Catering manager Violence: Not At Risk (04/08/2024)   Humiliation, Afraid, Rape, and Kick questionnaire    Fear of Current or Ex-Partner: No    Emotionally Abused: No    Physically Abused: No    Sexually Abused: No    Allergies:  Allergies  Allergen Reactions   Aleve [Naproxen] Anaphylaxis   Naproxen Sodium Anaphylaxis and Swelling   Codeine Nausea And Vomiting    Medications: Prior to Admission medications   Medication Sig Start Date End Date Taking? Authorizing Provider  acetaminophen  (TYLENOL ) 500 MG tablet Take 2 tablets (1,000 mg total) by mouth every 6 (six) hours. Patient not taking: Reported on 04/20/2024 04/09/24   Jayne Harlene CROME, CNM  benzocaine -Menthol  (DERMOPLAST) 20-0.5 % AERO Apply 1 Application topically as needed for irritation (perineal discomfort). 04/09/24   Jayne Harlene CROME, CNM  dibucaine (NUPERCAINAL) 1 % OINT Place 1 Application rectally as needed for hemorrhoids. 04/09/24   Jayne Harlene CROME, CNM  dicloxacillin  (DYNAPEN ) 500 MG capsule Take 1 capsule (500 mg total) by mouth 4 (four) times daily. 04/23/24   Dominic, Jinnie Jansky, CNM  docusate sodium  (COLACE) 100 MG capsule Take 1 capsule (100 mg total) by mouth 2 (two) times daily as needed for mild constipation. Patient not taking: Reported on 04/20/2024 04/09/24   Jayne Harlene CROME, CNM  ferrous sulfate 324 MG TBEC Take 20 mg by mouth. Patient not taking: Reported on 04/20/2024    [provider]  ibuprofen  (ADVIL ) 600 MG tablet Take 1 tablet (600 mg total) by mouth every 6 (six) hours. Patient not taking: Reported on 04/20/2024 04/09/24   Jayne Harlene CROME, CNM  norethindrone  (MICRONOR ) 0.35 MG tablet Take 1 tablet (0.35 mg total) by mouth daily. 05/09/24    Dominic, Jinnie Jansky, CNM  Prenatal Vit-Fe Fumarate-FA (MULTIVITAMIN-PRENATAL) 27-0.8 MG TABS tablet Take 1 tablet by mouth daily at 12 noon.    [provider]  sertraline (ZOLOFT) 50 MG tablet Take 50 mg by mouth daily.    [provider]  witch hazel-glycerin  (TUCKS) pad Apply 1 Application topically as needed for hemorrhoids. 04/09/24   Jayne Harlene CROME, CNM    Physical Exam currently breastfeeding.    General: NAD HEENT: normocephalic, anicteric Breasts: lactating, no redness or masses, nipples erect and intact bilaterally  Pulmonary: No increased work of breathing Abdomen: NABS, soft, non-tender, non-distended.  Umbilicus without lesions.  No hepatomegaly, splenomegaly or masses palpable. No evidence of hernia. Genitourinary:  External: Normal external female genitalia.  Normal urethral meatus, normal  Bartholin's and Skene's glands.  Laceration well healed. Tissue consistent with lactating state. Silver nitrate  placed at forchette  for pinpoint spot of granulation tissue.   Vagina: Normal vaginal mucosa, no evidence of prolapse.  Some tone   Cervix: exam not done   Uterus: Non-enlarged, mobile, normal contour.  No CMT  Adnexa: ovaries non-enlarged, no adnexal masses  Rectal: deferred small hemorrhoid present  Extremities: no edema, erythema, or tenderness Neurologic: Grossly intact Psychiatric: mood appropriate, affect full   Edinburgh Postnatal Depression Scale - 05/19/24 1429       Edinburgh Postnatal Depression Scale:  In the Past 7 Days   I have been able to laugh and see the funny side of things. 0    I have looked forward with enjoyment to things. 0    I have blamed myself unnecessarily when things went wrong. 1    I have been anxious or worried for no good reason. 2    I have felt scared or panicky for no good reason. 0    Things have been getting on top of me. 0    I have been so unhappy that I have had difficulty sleeping. 0    I have felt  sad or miserable. 0    I have been so unhappy that I have been crying. 0    The thought of harming myself has occurred to me. 0    Edinburgh Postnatal Depression Scale Total 3          Assessment: 33 y.o. G3P1011 presenting for 6 week postpartum visit  Plan: Problem List Items Addressed This Visit   None Visit Diagnoses       Care and examination of lactating mother    -  Primary     Encounter for screening for maternal depression            1) Contraception - Education given regarding options for contraception, as well as compatibility with breast feeding if applicable.  Patient plans on oral progesterone-only contraceptive for contraception.  2)  Pap - ASCCP guidelines and rational discussed.  ASCCP guidelines and rational discussed.  Patient opts for every 3 years screening interval  3) Patient underwent screening for postpartum depression with no signs of depression  4) No follow-ups on file.   Jinnie Cookey, CNM  [facility] 05/19/2024, 2:43 PM

## 2024-06-02 ENCOUNTER — Encounter: Payer: Self-pay | Admitting: Licensed Practical Nurse

## 2024-08-23 ENCOUNTER — Encounter: Payer: Self-pay | Admitting: Licensed Practical Nurse
